# Patient Record
Sex: Female | Born: 1970 | Hispanic: Yes
Health system: Southern US, Community
[De-identification: ages and names within clinical notes are randomized; demographics above are authoritative.]

## PROBLEM LIST (undated history)

## (undated) DIAGNOSIS — G43909 Migraine, unspecified, not intractable, without status migrainosus: Secondary | ICD-10-CM

## (undated) DIAGNOSIS — I00 Rheumatic fever without heart involvement: Secondary | ICD-10-CM

## (undated) DIAGNOSIS — R519 Headache, unspecified: Secondary | ICD-10-CM

## (undated) DIAGNOSIS — E559 Vitamin D deficiency, unspecified: Secondary | ICD-10-CM

## (undated) DIAGNOSIS — R51 Headache: Secondary | ICD-10-CM

## (undated) DIAGNOSIS — IMO0002 Reserved for concepts with insufficient information to code with codable children: Secondary | ICD-10-CM

## (undated) DIAGNOSIS — Z8759 Personal history of other complications of pregnancy, childbirth and the puerperium: Secondary | ICD-10-CM

## (undated) HISTORY — DX: Personal history of other complications of pregnancy, childbirth and the puerperium: Z87.59

## (undated) HISTORY — DX: Vitamin D deficiency, unspecified: E55.9

## (undated) HISTORY — PX: CERVICAL BIOPSY  W/ LOOP ELECTRODE EXCISION: SUR135

## (undated) HISTORY — PX: DIAGNOSTIC LAPAROSCOPY: SUR761

## (undated) HISTORY — PX: COLPOSCOPY: SHX161

## (undated) HISTORY — DX: Migraine, unspecified, not intractable, without status migrainosus: G43.909

## (undated) HISTORY — DX: Reserved for concepts with insufficient information to code with codable children: IMO0002

## (undated) HISTORY — DX: Rheumatic fever without heart involvement: I00

---

## 2002-04-13 HISTORY — PX: ECTOPIC PREGNANCY SURGERY: SHX613

## 2013-08-11 ENCOUNTER — Other Ambulatory Visit: Payer: Self-pay | Admitting: Family Medicine

## 2013-08-11 DIAGNOSIS — R109 Unspecified abdominal pain: Secondary | ICD-10-CM

## 2013-08-18 ENCOUNTER — Other Ambulatory Visit: Payer: Self-pay

## 2013-08-21 ENCOUNTER — Ambulatory Visit
Admission: RE | Admit: 2013-08-21 | Discharge: 2013-08-21 | Disposition: A | Discharge status: Home / Self Care | Payer: BC Managed Care – PPO | Source: Ambulatory Visit | Attending: Family Medicine | Admitting: Family Medicine

## 2013-08-21 ENCOUNTER — Other Ambulatory Visit: Payer: Self-pay

## 2013-08-21 DIAGNOSIS — R109 Unspecified abdominal pain: Secondary | ICD-10-CM

## 2013-08-21 MED ORDER — IOHEXOL 300 MG/ML  SOLN
100.0000 mL | Freq: Once | INTRAMUSCULAR | Status: AC | PRN
  Administered 2013-08-21: 100 mL via INTRAVENOUS

## 2013-12-26 ENCOUNTER — Ambulatory Visit: Payer: Self-pay | Admitting: Gynecology

## 2014-01-16 ENCOUNTER — Other Ambulatory Visit (HOSPITAL_COMMUNITY)
Admission: RE | Admit: 2014-01-16 | Discharge: 2014-01-16 | Disposition: A | Discharge status: Home / Self Care | Payer: BC Managed Care – PPO | Source: Ambulatory Visit | Attending: Gynecology | Admitting: Gynecology

## 2014-01-16 ENCOUNTER — Ambulatory Visit (INDEPENDENT_AMBULATORY_CARE_PROVIDER_SITE_OTHER): Payer: BC Managed Care – PPO | Admitting: Gynecology

## 2014-01-16 ENCOUNTER — Encounter: Payer: Self-pay | Admitting: Gynecology

## 2014-01-16 VITALS — BP 102/70 | Ht 62.5 in | Wt 115.0 lb

## 2014-01-16 DIAGNOSIS — R87810 Cervical high risk human papillomavirus (HPV) DNA test positive: Secondary | ICD-10-CM | POA: Insufficient documentation

## 2014-01-16 DIAGNOSIS — Z1151 Encounter for screening for human papillomavirus (HPV): Secondary | ICD-10-CM | POA: Insufficient documentation

## 2014-01-16 DIAGNOSIS — Z01419 Encounter for gynecological examination (general) (routine) without abnormal findings: Secondary | ICD-10-CM

## 2014-01-16 DIAGNOSIS — Z01411 Encounter for gynecological examination (general) (routine) with abnormal findings: Secondary | ICD-10-CM | POA: Diagnosis present

## 2014-01-16 DIAGNOSIS — N946 Dysmenorrhea, unspecified: Secondary | ICD-10-CM

## 2014-01-16 DIAGNOSIS — N920 Excessive and frequent menstruation with regular cycle: Secondary | ICD-10-CM

## 2014-01-16 MED ORDER — TRANEXAMIC ACID 650 MG PO TABS: ORAL_TABLET | ORAL | Status: DC

## 2014-01-16 NOTE — Patient Instructions (Signed)
Levonorgestrel intrauterine device (IUD) Qu es este medicamento? El LEVONORGESTREL (DIU) es un dispositivo anticonceptivo (control de natalidad). El dispositivo se coloca dentro del tero por un profesional de la salud. Se utiliza para evitar el embarazo y tambin se puede utilizar para tratar el sangrado abundante que ocurre durante su perodo. Dependiendo del dispositivo, se puede utilizar por 3 a 5 aos. Este medicamento puede ser utilizado para otros usos; si tiene alguna pregunta consulte con su proveedor de atencin mdica o con su farmacutico. MARCAS COMERCIALES DISPONIBLES: LILETTA, Mirena, Skyla Qu le debo informar a mi profesional de la salud antes de tomar este medicamento? Necesita saber si usted presenta alguno de los siguientes problemas o situaciones: -exmen de Papanicolaou anormal -cncer de mama, cuello del tero o tero -diabetes -endometritis -si tiene una infeccin plvica o genital actual o en el pasado -tiene ms de una pareja sexual o si su pareja tiene ms de una pareja -enfermedad cardiaca -antecedente de embarazo tubrico o ectpico -problemas del sistema inmunolgico -DIU colocado -enfermedad heptica o tumor del hgado -problemas con la coagulacin o si toma diluyentes sanguneos -usa medicamentos intravenoso -forma inusual del tero -sangrado vaginal que no tiene explicacin -una reaccin alrgica o inusual al levonorgestrel, a otras hormonas, a la silicona o polietilenos, a otros medicamentos, alimentos, colorantes o conservantes -si est embarazada o buscando quedar embarazada -si est amamantando a un beb Cmo debo utilizar este medicamento? Un profesional de la salud coloca este dispositivo en el tero. Hable con su pediatra para informarse acerca del uso de este medicamento en nios. Puede requerir atencin especial. Sobredosis: Pngase en contacto inmediatamente con un centro toxicolgico o una sala de urgencia si usted cree que haya tomado  demasiado medicamento. ATENCIN: Este medicamento es solo para usted. No comparta este medicamento con nadie. Qu sucede si me olvido de una dosis? No se aplica en este caso. Qu puede interactuar con este medicamento? No tome esta medicina con ninguno de los siguientes medicamentos: -amprenavir -bosentano -fosamprenavir Esta medicina tambin puede interactuar con los siguientes medicamentos: -aprepitant -barbitricos para producir el sueo o para el tratamiento de convulsiones -bexaroteno -griseofulvina -medicamentos para tratar los convulsiones, tales como carbamazepina, etotona, felbamato, oxcarbazepina, fenitona, topiramato -modafinilo -pioglitazona -rifabutina -rifampicina -rifapentina -algunos medicamentos para tratar el virus VIH, tales como atazanavir, indinavir, lopinavir, nelfinavir, tipranavir, ritonavir -hierba de [place] -warfarina Puede ser que esta lista no menciona todas las posibles interacciones. Informe a su profesional de la salud de todos los productos a base de hierbas, medicamentos de venta libre o suplementos nutritivos que est tomando. Si usted fuma, consume bebidas alcohlicas o si utiliza drogas ilegales, indqueselo tambin a su profesional de la salud. Algunas sustancias pueden interactuar con su medicamento. A qu debo estar atento al usar este medicamento? Visite a su mdico o a su profesional de la salud para chequear su evolucin peridicamente. Visite a su mdico si usted o su pareja tiene relaciones sexuales con otras personas, se vuelve VIH positivo o contrae una enfermedad de transmisin sexual. Este medicamento no la protege de la infeccin por VIH (SIDA) ni de ninguna otra enfermedad de transmisin sexual. Puede controlar la ubicacin del DIU usted misma palpando con sus dedos limpios los hilos en la parte anterior de la vagina. No tire de los hilos. Es un buen hbito controlar la ubicacin del dispositivo despus de cada perodo menstrual. Si  no slo siente los hilos sino que adems siente otra parte ms del DIU o si no puede sentir los hilos, consulte a su   mdico inmediatamente. El DIU puede salirse por s solo. Puede quedar embarazada si el dispositivo se sale de su lugar. Utilice un mtodo anticonceptivo adicional, como preservativos, y consulte a su proveedor de atencin mdica s observa que el DIU se sali de su lugar. La utilizacin de tampones no cambia la posicin del DIU y no hay inconvenientes en usarlos durante su perodo. Qu efectos secundarios puedo tener al utilizar este medicamento? Efectos secundarios que debe informar a su mdico o a su profesional de la salud tan pronto como sea posible: -reacciones alrgicas como erupcin cutnea, picazn o urticarias, hinchazn de la cara, labios o lengua -fiebre, sntomas gripales -llagas genitales -alta presin sangunea -ausencia de un perodo menstrual durante 6 semanas mientras lo utiliza -dolor, hinchazn o calor en las piernas -dolor o sensibilidad del plvico -dolor de cabeza repentino o severo -signos de embarazo -calambres estomacales -falta de aliento repentina -problemas de coordinacin, del habla, al caminar -sangrado, flujo vaginal inusual -color amarillento de los ojos o la piel Efectos secundarios que, por lo general, no requieren atencin mdica (debe informarlos a su mdico o a su profesional de la salud si persisten o si son molestos): -acn -dolor de pecho -cambios en el deseo sexual o capacidad -cambios de peso -calambres, mareos o sensacin de desmayo mientras se introduce el dispositivo -dolor de cabeza -sangrado menstruales irregulares en los primeros 3 a 6 meses de usar -nuseas Puede ser que esta lista no menciona todos los posibles efectos secundarios. Comunquese a su mdico por asesoramiento mdico sobre los efectos secundarios. Usted puede informar los efectos secundarios a la FDA por telfono al 1-800-FDA-1088. Dnde debo guardar mi  medicina? No se aplica en este caso. ATENCIN: Este folleto es un resumen. Puede ser que no cubra toda la posible informacin. Si usted tiene preguntas acerca de esta medicina, consulte con su mdico, su farmacutico o su profesional de la salud.  2015, Elsevier/Gold Standard. (2011-05-19 16:57:41)  

## 2014-01-16 NOTE — Progress Notes (Signed)
Carolyn Fletcher 03/18/1971 [id number]   History:    43 y.o.  for annual gyn exam as a new patient to the practice. Patient's been complaining of dysmenorrhea and menorrhagia that has been worse for the past 5 months with passage of large clots. She denies any intermenstrual bleeding. Patient separated from her spouse several months ago. She had been using condoms for contraception. 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 Her last Pap smear was in [place] in 2014 we do not have any records. Her mammogram was also 3 years ago. Patient had a colonoscopy in 2013 because of rectal bleeding which turned out to be hemorrhoids. She does have a cousin who had anal cancer and both grandfather and grandmother on father's side had colorectal cancer. Patient received the flu vaccine 3 weeks ago.  Past medical history,surgical history, family history and social history were all reviewed and documented in the EPIC chart.  Gynecologic History Patient's last menstrual period was 12/25/2013. Contraception: none Last Pap: 2014. Results were: Patient states it was normal done in [place] we do not have the report Last mammogram: 2012. Results were: Done in [place] patient's stay was normal  Obstetric History OB History  Gravida Para Term Preterm AB SAB TAB Ectopic Multiple Living  4 3   1   1  3     # Outcome Date GA Lbr Len/2nd Weight Sex Delivery Anes PTL Lv  4 ECT           3 PAR           2 PAR           1 PAR                ROS: A ROS was performed and pertinent positives and negatives are included in the history.  GENERAL: No fevers or chills. HEENT: No change in vision, no earache, sore throat or sinus congestion. NECK: No pain or stiffness. CARDIOVASCULAR: No chest pain or pressure. No palpitations. PULMONARY: No  shortness of breath, cough or wheeze. GASTROINTESTINAL: No abdominal pain, nausea, vomiting or diarrhea, melena or bright red blood per rectum. GENITOURINARY: No urinary frequency, urgency, hesitancy or dysuria. MUSCULOSKELETAL: No joint or muscle pain, no back pain, no recent trauma. DERMATOLOGIC: No rash, no itching, no lesions. ENDOCRINE: No polyuria, polydipsia, no heat or cold intolerance. No recent change in weight. HEMATOLOGICAL: No anemia or easy bruising or bleeding. NEUROLOGIC: No headache, seizures, numbness, tingling or weakness. PSYCHIATRIC: No depression, no loss of interest in normal activity or change in sleep pattern.     Exam: chaperone present  BP 102/70  Ht 5' 2.5" (1.588 m)  Wt 115 lb (52.164 kg)  BMI 20.69 kg/m2  LMP 12/25/2013  Body mass index is 20.69 kg/(m^2).  General appearance : Well developed well nourished female. No acute distress HEENT: Neck supple, trachea midline, no carotid bruits, no thyroidmegaly Lungs: Clear to auscultation, no rhonchi or wheezes, or rib retractions  Heart: Regular rate and rhythm, no murmurs or gallops Breast:Examined in sitting and supine position were symmetrical in appearance, no palpable masses or tenderness,  no skin retraction, no nipple inversion, no nipple discharge, no skin discoloration, no axillary or supraclavicular lymphadenopathy Abdomen: no palpable masses or tenderness, no rebound or guarding Extremities: no  edema or skin discoloration or tenderness  Pelvic:  Bartholin, Urethra, Skene Glands: Within normal limits             Vagina: No gross lesions or discharge  Cervix: No gross lesions or discharge  Uterus  anteverted, normal size, shape and consistency, non-tender and mobile  Adnexa  Without masses or tenderness  Anus and perineum  normal   Rectovaginal  normal sphincter tone without palpated masses or tenderness             Hemoccult not provided     Assessment/Plan:  43 y.o. female for annual exam was  counseled and given information on the Mirena IUD to help with her menorrhagia as well as for contraception. I did prescribe her Lysteda  650 mg to take 2 tablets 3 times a day for 5 days during her cycle to help with her menorrhagia and dysmenorrhea until she has the Mirena IUD placed. The literature information was provided. Pap smear was done today. [facility] Ashby DawesGrube her blood work recently. She's currently being treated for vitamin D deficiency. She was given a requisition to schedule her mammogram. We discussed importance of monthly breast exam as was importance of calcium vitamin D and regular exercise for osteoporosis prevention.  Ok EdwardsFERNANDEZ,Carolyn Hoggard H MD, 2:55 PM 01/16/2014

## 2014-01-19 LAB — CYTOLOGY - PAP

## 2014-01-22 ENCOUNTER — Other Ambulatory Visit: Payer: Self-pay | Admitting: Gynecology

## 2014-01-22 ENCOUNTER — Telehealth: Payer: Self-pay | Admitting: Gynecology

## 2014-01-22 DIAGNOSIS — Z30431 Encounter for routine checking of intrauterine contraceptive device: Secondary | ICD-10-CM

## 2014-01-22 MED ORDER — LEVONORGESTREL 20 MCG/24HR IU IUD: INTRAUTERINE_SYSTEM | Freq: Once | INTRAUTERINE | Status: AC

## 2014-01-22 NOTE — Telephone Encounter (Signed)
01/22/14-I spoke w/pt  Today and told her that [facility] for contraception at 100%(per JF that is dx,not menorrhagia). She will call first day of next cycle to schedule.wl

## 2014-01-23 ENCOUNTER — Encounter: Payer: Self-pay | Admitting: Gynecology

## 2014-01-23 ENCOUNTER — Ambulatory Visit (INDEPENDENT_AMBULATORY_CARE_PROVIDER_SITE_OTHER): Payer: BC Managed Care – PPO | Admitting: Gynecology

## 2014-01-23 VITALS — BP 110/70 | Ht 64.0 in | Wt 116.0 lb

## 2014-01-23 DIAGNOSIS — R896 Abnormal cytological findings in specimens from other organs, systems and tissues: Secondary | ICD-10-CM

## 2014-01-23 DIAGNOSIS — Z30430 Encounter for insertion of intrauterine contraceptive device: Secondary | ICD-10-CM

## 2014-01-23 DIAGNOSIS — IMO0002 Reserved for concepts with insufficient information to code with codable children: Secondary | ICD-10-CM

## 2014-01-23 DIAGNOSIS — Z975 Presence of (intrauterine) contraceptive device: Secondary | ICD-10-CM

## 2014-01-23 NOTE — Progress Notes (Signed)
   Patient presented to the office today for placement of Mirena IUD for contraception and also for her dysmenorrhea and menorrhagia. She was seen in the office as a new patient on October 26 for her annual exam. Her Pap smear had demonstrated atypical squamous cells of undetermined significance with negative HPV. 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 Her last Pap smear prior to this one [place] in [place].                                                                    IUD procedure note       Patient presented to the office today for placement of Mirena IUD. The patient had previously been provided with literature information on this method of contraception. The risks benefits and pros and cons were discussed and all her questions were answered. She is fully aware that this form of contraception is 99% effective and is good for 5 years.  Pelvic exam: Bartholin urethra Skene glands: Within normal limits Vagina: No lesions or discharge, menstrual blood Cervix: No lesions or discharge Uterus: Anteverted position Adnexa: No masses or tenderness Rectal exam: Not done  The cervix was cleansed with Betadine solution. A single-tooth tenaculum was placed on the anterior cervical lip. The uterus sounded to 7 centimeter. The IUD was shown to the patient and inserted in a sterile fashion. The IUD string was trimmed. The single-tooth tenaculum was removed. Patient was instructed to return back to the office in one month for follow up.  Patient was informed of the atypical squamous cells of undetermined significance Pap smear result with negative HPV and the new guidelines in which the recommendation is to followup with a Pap smear in one year.      Lot. No.[id number]

## 2014-01-23 NOTE — Patient Instructions (Signed)
Colocacin de un dispositivo intrauterino (Intrauterine Device Insertion) El dispositivo intrauterino (DIU) se inserta en el tero para Therapist, occupational. [employer] tipos de DIU:  DIU de cobre: este tipo de DIU crea un ambiente desfavorable para la supervivencia de los espermatozoides. El mecanismo de accin no se conoce con Media planner. Puede permanecer colocado durante 10 aos.  DIU hormonal: este tipo de DIU contiene la hormona progestina (progesterona sinttica). La progestina espesa el moco cervical y evita que los espermatozoides ingresen al tero y tambin afina la membrana que tapiza interiormente al tero. No hay evidencias de que el DIU hormonal evite la implantacin. Un DIU hormonal puede dejarse colocado durante un mximo de 66aos, y un DIU hormonal diferente, durante un mximo de 3aos. Es el mtodo anticonceptivo de mejor relacin entre el costo y la efectividad si se deja colocado todo el tiempo de su duracin. Se puede retirar en cualquier momento. INFORME A SU MDICO:  Cualquier alergia que tenga.  Todos los [employer] [place], incluyendo vitaminas, hierbas, gotas oftlmicas, cremas y medicamentos de venta libre.  Problemas previos que usted o los [employer] de su familia hayan tenido con el uso de anestsicos.  Enfermedades de [employer].  Cirugas previas.  Posible embarazo.  Enfermedades patolgicas. RIESGOS Y COMPLICACIONES  Generalmente, la colocacin de un dispositivo intrauterino es un procedimiento seguro. Sin embargo, Games developer procedimiento, pueden surgir complicaciones. Las complicaciones posibles son:  Pinchazo accidental (perforacin) del tero.  La colocacin accidental del DIU en la capa muscular del tero (miometrio) o fuera del tero. Si esto sucede, el DIU puede quedar flotando alrededor [employer] intestinos y debe extraerse quirrgicamente.  El DIU puede salirse del tero (expulsin). Esto es ms frecuente en las mujeres que han dado a luz  recientemente.  Embarazo en la trompa de [place] (ectpico).  Enfermedad plvica inflamatoria (EPI), una infeccin del tero y las trompas de [place]. Mayor riesgo de tener EPI durante los primeros 20das despus de la colocacin del DIU; sin embargo, el riesgo general sigue siendo muy bajo. ANTES DEL PROCEDIMIENTO  Programe la insercin del DIU para cuando tenga el perodo menstrual o inmediatamente despus, para asegurarse de que no est embarazada. La colocacin del DIU se tolera mejor poco despus del ciclo menstrual.  Podra necesitar anlisis o ser examinada para asegurarse de que no est embarazada.  Es posible que tenga que hacerse un test de [place].  Podrn indicarle anlisis para descartar enfermedades de transmisin sexual (ETS) antes de Programmer, systems. Colocar un DIU a una mujer que tiene una infeccin puede hacer que esta empeore.  Podrn indicarle que tome un analgsico 1 o 2 horas antes del procedimiento.  Se realiza un examen para determinar el tamao y la posicin del tero.  Consulte a su mdico si debe cambiar o suspender los medicamentos que toma habitualmente. PROCEDIMIENTO   Se coloca un instrumento (espculo) en la vagina que le permite a su mdico observar la parte inferior del tero (cuello del tero).  El cuello del tero se prepara con un medicamento que disminuye el riesgo de infeccin.  Tal vez le apliquen un medicamento para adormecer cada lado del cuello del tero (bloqueo intracervical o paracervical). Esto se realiza para evitar y Engineer, maintenance con la insercin.  Se insertar un instrumento (sonda uterina) en el tero para determinar la longitud de la cavidad uterina y la direccin hacia la cual el tero puede inclinarse.  Se coloca un dispositivo delgado (insertador del DIU) a travs del canal del  cuello del tero Newell Rubbermaidhasta el tero.  El DIU se coloca en la cavidad uterina y el dispositivo de insercin se retira.  Se recorta el hilo de  nylon que est unido al DIU y que se [place]utiliza para su extraccin final. Se recorta de forma que quede en la vagina, apenas afuera del cuello uterino. DESPUS DEL PROCEDIMIENTO  Podr tener sangrado despus del procedimiento. Esto es normal. Vara desde un sangrado ligero durante un par de Countrywide Financialdas hasta un sangrado similar al menstrual.  Podr sentir clicos leves. Document Released: 12/23/2011 Document Revised: 01/18/2013 [facility] Patient Information 2015 [place], [place]. This information is not intended to replace advice given to you by your health care provider. Make sure you discuss any questions you have with your health care provider.

## 2014-02-12 ENCOUNTER — Encounter: Payer: Self-pay | Admitting: Gynecology

## 2014-02-20 ENCOUNTER — Ambulatory Visit: Payer: BC Managed Care – PPO | Admitting: Gynecology

## 2014-03-07 ENCOUNTER — Encounter: Payer: Self-pay | Admitting: Gynecology

## 2014-03-07 ENCOUNTER — Ambulatory Visit (INDEPENDENT_AMBULATORY_CARE_PROVIDER_SITE_OTHER): Payer: BC Managed Care – PPO | Admitting: Gynecology

## 2014-03-07 VITALS — BP 110/76

## 2014-03-07 DIAGNOSIS — Z30431 Encounter for routine checking of intrauterine contraceptive device: Secondary | ICD-10-CM

## 2014-03-07 DIAGNOSIS — IMO0002 Reserved for concepts with insufficient information to code with codable children: Secondary | ICD-10-CM | POA: Insufficient documentation

## 2014-03-07 DIAGNOSIS — R896 Abnormal cytological findings in specimens from other organs, systems and tissues: Secondary | ICD-10-CM

## 2014-03-07 MED ORDER — ESTRADIOL 0.5 MG PO TABS: ORAL_TABLET | ORAL | Status: DC

## 2014-03-07 NOTE — Progress Notes (Signed)
   Patient presented to the office today for her 1 month follow-up after having placed the Mirena IUD on 01/23/2014. Patient had heavy bleeding the first few weeks and has been spotting on and off. 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 Her last Pap smear prior to this one [place].  Exam: Bartholin urethra Skene was within normal limits Vagina: Menstrual blood was present Cervix: Distal blood was present IUD string was seen Bimanual exam uterus normal size shape and consistency anteverted nontender Adnexa: No palpable masses or tenderness Rectal exam not done  Explained to the patient that according to the new ASCCP guidelines of the recommendation with a negative HPV would be to follow-up with a Pap smear in 3 years. Since we do not have records from [place] a would like to repeat her Pap smear 1 year and if still negative then we can follow with the new guidelines every 3 years. She's also been placed on Estrace 0.5 mg 1 by mouth daily for 25 days to stabilize her endometrium down her bleeding. Risks benefits and pros and cons were discussed. Patient several years ago had been on the oral contraceptive pills without any complications.

## 2015-03-13 ENCOUNTER — Encounter: Payer: Self-pay | Admitting: Gynecology

## 2015-03-15 ENCOUNTER — Encounter: Payer: Self-pay | Admitting: Gynecology

## 2015-05-16 ENCOUNTER — Ambulatory Visit (INDEPENDENT_AMBULATORY_CARE_PROVIDER_SITE_OTHER): Payer: 59 | Admitting: Gynecology

## 2015-05-16 ENCOUNTER — Encounter: Payer: Self-pay | Admitting: Gynecology

## 2015-05-16 ENCOUNTER — Other Ambulatory Visit (HOSPITAL_COMMUNITY)
Admission: RE | Admit: 2015-05-16 | Discharge: 2015-05-16 | Disposition: A | Discharge status: Home / Self Care | Payer: 59 | Source: Ambulatory Visit | Attending: Gynecology | Admitting: Gynecology

## 2015-05-16 VITALS — BP 118/80 | Ht 64.0 in | Wt 123.0 lb

## 2015-05-16 DIAGNOSIS — N9489 Other specified conditions associated with female genital organs and menstrual cycle: Secondary | ICD-10-CM | POA: Diagnosis not present

## 2015-05-16 DIAGNOSIS — Z01411 Encounter for gynecological examination (general) (routine) with abnormal findings: Secondary | ICD-10-CM | POA: Insufficient documentation

## 2015-05-16 DIAGNOSIS — B9689 Other specified bacterial agents as the cause of diseases classified elsewhere: Secondary | ICD-10-CM

## 2015-05-16 DIAGNOSIS — IMO0002 Reserved for concepts with insufficient information to code with codable children: Secondary | ICD-10-CM

## 2015-05-16 DIAGNOSIS — Z1151 Encounter for screening for human papillomavirus (HPV): Secondary | ICD-10-CM | POA: Diagnosis present

## 2015-05-16 DIAGNOSIS — Z01419 Encounter for gynecological examination (general) (routine) without abnormal findings: Secondary | ICD-10-CM

## 2015-05-16 DIAGNOSIS — A499 Bacterial infection, unspecified: Secondary | ICD-10-CM | POA: Diagnosis not present

## 2015-05-16 DIAGNOSIS — N760 Acute vaginitis: Secondary | ICD-10-CM | POA: Diagnosis not present

## 2015-05-16 DIAGNOSIS — R102 Pelvic and perineal pain unspecified side: Secondary | ICD-10-CM

## 2015-05-16 DIAGNOSIS — N898 Other specified noninflammatory disorders of vagina: Secondary | ICD-10-CM

## 2015-05-16 DIAGNOSIS — R896 Abnormal cytological findings in specimens from other organs, systems and tissues: Secondary | ICD-10-CM | POA: Diagnosis not present

## 2015-05-16 LAB — WET PREP FOR TRICH, YEAST, CLUE
CLUE CELLS WET PREP: NONE SEEN
Trich, Wet Prep: NONE SEEN
Yeast Wet Prep HPF POC: NONE SEEN

## 2015-05-16 MED ORDER — TINIDAZOLE 500 MG PO TABS
ORAL_TABLET | ORAL | Status: DC
Start: 2015-05-16 — End: 2015-09-12

## 2015-05-16 NOTE — Addendum Note (Signed)
Addended by: Rushie Goltz on: 05/16/2015 12:45 PM   Modules accepted: Orders

## 2015-05-16 NOTE — Patient Instructions (Signed)
Tinidazole tablets Qu es este medicamento? El TINIDAZOL es un medicamento antiinfeccioso. Se utiliza para tratar la amebiasis, giardiasis, tricomonosis y vaginosis. No es efectivo para resfros, gripe u otras infecciones de origen viral. Este medicamento puede ser utilizado para otros usos; si tiene alguna pregunta consulte con su proveedor de atencin mdica o con su farmacutico. Qu le debo informar a mi profesional de la salud antes de tomar este medicamento? Necesita saber si usted presenta alguno de los siguientes problemas o situaciones: -anemia u otros trastornos sanguneos -si consume bebidas alcohlicas con frecuencia -recibe hemodilisis -trastorno de convulsiones -una reaccin alrgica o inusual al tinidazol, a otros medicamentos, alimentos, colorantes o conservantes -si est embarazada o buscando quedar embarazada -si est amamantando a un beb Cmo debo utilizar este medicamento? Tome este medicamento por va oral con un vaso lleno de agua. Siga las instrucciones de la etiqueta del medicamento. Tomar con alimentos. Tome sus dosis a intervalos regulares. No tome su medicamento con una frecuencia mayor a la indicada. Complete todas las dosis de su medicamento como se le haya indicado aun si se siente mejor. No omita ninguna dosis o suspenda el uso de su medicamento antes de lo indicado. Hable con su pediatra para informarse acerca del uso de este medicamento en nios. Aunque este medicamento ha sido recetado a nios tan menores como de 3 aos de edad para condiciones selectivas, las precauciones se aplican. Sobredosis: Pngase en contacto inmediatamente con un centro toxicolgico o una sala de urgencia si usted cree que haya tomado demasiado medicamento. ATENCIN: Este medicamento es solo para usted. No comparta este medicamento con nadie. Qu sucede si me olvido de una dosis? Si olvida una dosis, tmela lo antes posible. Si es casi la hora de la prxima dosis, tome slo esa  dosis. No tome dosis adicionales o dobles. Qu puede interactuar con este medicamento? No tome esta medicina con ninguno de los siguientes medicamentos: -alcohol o cualquier producto que contenga alcohol -solucin oral de amprenavir -disulfiram -inyeccin de paclitaxel -solucin oral de ritonavir -solucin oral de sertralina -inyeccin de sulfametoxasol-trimetoprima Esta medicina tambin puede interactuar con los siguientes medicamentos: -colestiramina -cimetidina -conivaptn -ciclosporina -fluorouracilo -fosfenitona, fenitona -quetoconazol -litio -fenobarbital -tacrolimo -warfarina Puede ser que esta lista no menciona todas las posibles interacciones. Informe a su profesional de la salud de todos los productos a base de hierbas, medicamentos de venta libre o suplementos nutritivos que est tomando. Si usted fuma, consume bebidas alcohlicas o si utiliza drogas ilegales, indqueselo tambin a su profesional de la salud. Algunas sustancias pueden interactuar con su medicamento. A qu debo estar atento al usar este medicamento? Si los sntomas no mejoran o si empeoran, consulte con su mdico o con su profesional de la salud. Evite consumir las bebidas alcohlicas mientras toma este medicamento y durante tres das despus. El alcohol puede hacerle sentir mareado, enfermo o enrojecimiento. Si est recibiendo tratamiento para alguna enfermedad de transmisin sexual, evite todo contacto sexual hasta que haya terminado el tratamiento. Es posible que su pareja tambin necesite este tratamiento. Qu efectos secundarios puedo tener al utilizar este medicamento? Efectos secundarios que debe informar a su mdico o a su profesional de la salud tan pronto como sea posible: -reacciones alrgicas como erupcin cutnea, picazn o urticarias, hinchazn de la cara, labios o lengua -problemas respiratorios -confusin, depresin -manchas oscuras o blancas en la boca -sensacin de desmayos o mareos,  cadas -fiebre, infeccin -entumecimiento, hormigueo, dolor o debilidad en las manos o pies -dolor al orinar -convulsiones -cansancio o debilidad inusual -irritacin   o flujo vaginal -vmito Efectos secundarios que, por lo general, no requieren atencin mdica (debe informarlos a su mdico o a su profesional de la salud si persisten o si son molestos): -orina de color marrn oscuro o rojo -diarrea -dolor de cabeza -prdida del apetito -sabor metlico -nuseas -Higher education careers adviser Puede ser que esta lista no menciona todos los posibles efectos secundarios. Comunquese a su mdico por asesoramiento mdico [employer] Usted puede informar los efectos secundarios a la FDA por telfono al 1-800-FDA-1088. Dnde debo guardar mi medicina? Mantngala fuera del alcance de los nios. Gurdela a [employer], entre 15 y 55 grados C (51 y 89 grados F). Protjala de la luz y de la humedad. Mantenga el envase bien cerrado. Deseche todo el medicamento que no haya utilizado, despus de la fecha de vencimiento. ATENCIN: Este folleto es un resumen. Puede ser que no cubra toda la posible informacin. Si usted tiene preguntas acerca de esta medicina, consulte con su mdico, su farmacutico o su profesional de Technical sales engineer.    2016, Elsevier/Gold Standard. (2014-05-22 00:00:00) Vaginosis bacteriana (Bacterial Vaginosis) La vaginosis bacteriana es una infeccin vaginal que perturba el equilibrio normal de las bacterias que se encuentran en la vagina. Es el resultado de un crecimiento excesivo de ciertas bacterias. Esta es la infeccin vaginal ms frecuente en mujeres en edad reproductiva. El tratamiento es importante para prevenir complicaciones, especialmente en mujeres embarazadas, dado que puede causar un parto prematuro. CAUSAS  La vaginosis bacteriana se origina por un aumento de bacterias nocivas que, generalmente, estn presentes en cantidades ms pequeas en la vagina. Varios  tipos diferentes de bacterias pueden causar esta afeccin. Sin embargo, la causa de su desarrollo no se comprende totalmente. [place] o comportamientos pueden exponerlo a un mayor riesgo de desarrollar vaginosis bacteriana, entre los que se incluyen:  Tener una nueva pareja sexual o mltiples parejas sexuales.  Las duchas vaginales  El uso del DIU (dispositivo intrauterino) como mtodo anticonceptivo. El contagio no se produce en baos, por ropas de cama, en piscinas o por contacto con objetos. SIGNOS Y SNTOMAS  Algunas mujeres que padecen vaginosis bacteriana no presentan signos ni sntomas. Los sntomas ms comunes son:  Secrecin vaginal de color grisceo.  Secrecin vaginal con olor similar al [employer], especialmente despus de Retail banker.  Picazn o sensacin de ardor en la vagina o la vulva.  Ardor o dolor al [employer]. DIAGNSTICO  Su mdico analizar su historia clnica y le examinar la vagina para detectar signos de vaginosis bacteriana. Puede tomarle Truddie Coco de flujo vaginal. Su mdico examinar esta muestra con un microscopio para controlar las bacterias y clulas anormales. Tambin puede realizarse un anlisis del pH vaginal.  TRATAMIENTO  La vaginosis bacteriana puede tratarse con antibiticos, en forma de comprimidos o de crema vaginal. Puede indicarse una segunda tanda de antibiticos si la afeccin se repite despus del tratamiento. Debido a que la vaginosis bacteriana aumenta el riesgo de contraer enfermedades de transmisin sexual, el tratamiento puede ayudar a reducir el riesgo de clamidia, [place], VIH y herpes. [place] solo medicamentos de venta libre o recetados, segn las indicaciones del mdico.  Si le han recetado antibiticos, tmelos como se le indic. Asegrese de que finaliza la prescripcin completa aunque se sienta mejor.  Comunique a sus compaeros sexuales que  sufre una infeccin vaginal. Deben consultar a su mdico y recibir tratamiento si tienen problemas, como picazn o una erupcin cutnea  leve.  Durante el tratamiento, es importante que siga estas indicaciones:  Evite mantener relaciones sexuales o use preservativos de la forma correcta.  No se haga duchas vaginales.  Evite consumir alcohol como se lo haya indicado el mdico.  Community education officer se lo haya indicado el mdico. SOLICITE ATENCIN MDICA SI:   Sus sntomas no mejoran despus de 3 das de [place].  Aumenta la secrecin o Conservation officer, historic buildings.  Tiene fiebre. ASEGRESE DE QUE:   Comprende estas instrucciones.  Controlar su afeccin.  Recibir ayuda de inmediato si no mejora o si empeora. PARA OBTENER MS INFORMACIN  Centros para el control y la prevencin de Probation officer for Disease Control and Prevention, CDC): [employer] Asociacin Estadounidense de la Salud Sexual (American Sexual Health Association, SHA): www.ashastd.org    Esta informacin no tiene Marine scientist el consejo del mdico. Asegrese de hacerle al mdico cualquier pregunta que tenga.   Document Released: 07/07/2007 Document Revised: 04/20/2014 Elsevier Interactive Patient Education [employer].

## 2015-05-16 NOTE — Progress Notes (Signed)
Carolyn Fletcher [birth year] [id number]   History:    45 y.o.  for annual gyn exam who was complaining of vaginal odor. She has not been sexually active in over 2 years. She had a Mirena IUD placed in 2015 and isno longer suffering from menorrhagia passage of large blood clots and cramping. 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 Her last Pap smear was in [place] in 2014 we do not have any records. Her mammogram was also 3 years ago. Patient had a colonoscopy in 2013 because of rectal bleeding which turned out to be hemorrhoids. She does have a cousin who had anal cancer and both grandfather and grandmother on father's side had colorectal cancer.  Patient also was complaining of a tenderness no movement of her right shoulder.   Past medical history,surgical history, family history and social history were all reviewed and documented in the EPIC chart.  Gynecologic History Patient's last menstrual period was 01/13/2015 (lmp unknown). Contraception: IUD Last Pap: 2015. Results were: ASCUS negative HPV Last mammogram: See above. Results were: See above  Obstetric History OB History  Gravida Para Term Preterm AB SAB TAB Ectopic Multiple Living  # Outcome Date GA Lbr Len/2nd Weight Sex Delivery Anes PTL Lv  4 Ectopic           3 Para           2 Para           1 Para                ROS: A ROS was performed and pertinent positives and negatives are included in the history.  GENERAL: No fevers or chills. HEENT: No change in vision, no earache, sore throat or sinus congestion. NECK: No pain or stiffness. CARDIOVASCULAR: No chest pain or pressure. No palpitations. PULMONARY: No shortness of breath, cough or wheeze. GASTROINTESTINAL: No abdominal pain, nausea, vomiting or diarrhea, melena or bright red blood  per rectum. GENITOURINARY: No urinary frequency, urgency, hesitancy or dysuria. MUSCULOSKELETAL: No joint or muscle pain, no back pain, no recent trauma. DERMATOLOGIC: No rash, no itching, no lesions. ENDOCRINE: No polyuria, polydipsia, no heat or cold intolerance. No recent change in weight. HEMATOLOGICAL: No anemia or easy bruising or bleeding. NEUROLOGIC: No headache, seizures, numbness, tingling or weakness. PSYCHIATRIC: No depression, no loss of interest in normal activity or change in sleep pattern.     Exam: chaperone present  BP 118/80 mmHg  Ht  (1.626 m)  Wt 123 lb (55.792 kg)  BMI 21.10 kg/m2  LMP 01/13/2015 (LMP Unknown)  Body mass index is 21.1 kg/(m^2).  General appearance : Well developed well nourished female. No acute distress HEENT: Eyes: no retinal hemorrhage or exudates,  Neck supple, trachea midline, no carotid bruits, no thyroidmegaly Lungs: Clear to auscultation, no rhonchi or wheezes, or rib retractions  Heart: Regular rate and rhythm, no murmurs or gallops Breast:Examined in sitting and supine position were symmetrical in appearance, no palpable masses or tenderness,  no skin retraction, no nipple inversion, no nipple discharge, no skin discoloration, no axillary or supraclavicular lymphadenopathy Abdomen: no palpable masses or tenderness, no rebound or guarding Extremities: no edema or skin discoloration or tenderness, limited range of motion right  shoulder area tender 2 hyperpigmented skin lesion base of neck on right side Pelvic:  Bartholin, Urethra, Skene Glands: Within normal limits             Vagina: No gross lesions or discharge  Cervix: No gross lesions or discharge, IUD string seen  Uterus  anteverted, normal size, shape and consistency, non-tender and mobile  Adnexa tender right lower quadrant  Anus and perineum  normal   Rectovaginal  normal sphincter tone without palpated masses or tenderness             Hemoccult not indicated   Wet prep  moderate WBC, moderate bacteria  Assessment/Plan:  45 y.o. female for annual exam will be treated with Tindamax 500 mg tablet. 4 tablets today for talus tomorrow for bacterial vaginosis. Patient will be referred to the orthopedic surgeon for possible rotator cuff injury. She'll also be referred to the dermatologist for biopsy of the hyperpigmented lesion at the base of her neck. Pap smear with HPV screening done today since 12 months ago her Pap smear demonstrated ascus but negative HPV. An ultrasound will be ordered to assess her right lower quadrants and she was tender and I could not palpate her ovary because of some guarding. She will come at the same time for her fasting blood work to include the following: Comprehensive metabolic panel, TSH, CBC, fasting lipid profile, and urinalysis.   Ok Edwards MD, 12:13 PM 05/16/2015

## 2015-05-17 LAB — URINALYSIS W MICROSCOPIC + REFLEX CULTURE
BACTERIA UA: NONE SEEN [HPF]
BILIRUBIN URINE: NEGATIVE
CASTS: NONE SEEN [LPF]
CRYSTALS: NONE SEEN [HPF]
Glucose, UA: NEGATIVE
Hgb urine dipstick: NEGATIVE
KETONES UR: NEGATIVE
Leukocytes, UA: NEGATIVE
Nitrite: NEGATIVE
PROTEIN: NEGATIVE
Specific Gravity, Urine: 1.016 (ref 1.001–1.035)
Squamous Epithelial / LPF: NONE SEEN [HPF] (ref <=5)
WBC UA: NONE SEEN WBC/HPF (ref <=5)
Yeast: NONE SEEN [HPF]
pH: 6 (ref 5.0–8.0)

## 2015-05-18 LAB — URINE CULTURE
Colony Count: NO GROWTH
ORGANISM ID, BACTERIA: NO GROWTH

## 2015-05-20 ENCOUNTER — Other Ambulatory Visit: Payer: Self-pay

## 2015-05-20 DIAGNOSIS — Z1231 Encounter for screening mammogram for malignant neoplasm of breast: Secondary | ICD-10-CM

## 2015-05-21 LAB — CYTOLOGY - PAP

## 2015-05-22 ENCOUNTER — Other Ambulatory Visit: Payer: Self-pay | Admitting: Gynecology

## 2015-05-22 ENCOUNTER — Encounter: Payer: Self-pay | Admitting: Gynecology

## 2015-05-22 ENCOUNTER — Ambulatory Visit (INDEPENDENT_AMBULATORY_CARE_PROVIDER_SITE_OTHER): Payer: 59

## 2015-05-22 ENCOUNTER — Ambulatory Visit (INDEPENDENT_AMBULATORY_CARE_PROVIDER_SITE_OTHER): Payer: 59 | Admitting: Gynecology

## 2015-05-22 VITALS — BP 120/76

## 2015-05-22 DIAGNOSIS — R188 Other ascites: Secondary | ICD-10-CM

## 2015-05-22 DIAGNOSIS — R102 Pelvic and perineal pain: Secondary | ICD-10-CM

## 2015-05-22 DIAGNOSIS — N839 Noninflammatory disorder of ovary, fallopian tube and broad ligament, unspecified: Secondary | ICD-10-CM | POA: Diagnosis not present

## 2015-05-22 DIAGNOSIS — N7011 Chronic salpingitis: Secondary | ICD-10-CM

## 2015-05-22 DIAGNOSIS — N838 Other noninflammatory disorders of ovary, fallopian tube and broad ligament: Secondary | ICD-10-CM

## 2015-05-22 DIAGNOSIS — R896 Abnormal cytological findings in specimens from other organs, systems and tissues: Secondary | ICD-10-CM

## 2015-05-22 DIAGNOSIS — IMO0002 Reserved for concepts with insufficient information to code with codable children: Secondary | ICD-10-CM

## 2015-05-22 DIAGNOSIS — N83202 Unspecified ovarian cyst, left side: Secondary | ICD-10-CM | POA: Diagnosis not present

## 2015-05-22 MED ORDER — MEDROXYPROGESTERONE ACETATE 150 MG/ML IM SUSP
150.0000 mg | Freq: Once | INTRAMUSCULAR | Status: AC
  Administered 2015-05-22: 150 mg via INTRAMUSCULAR

## 2015-05-22 NOTE — Patient Instructions (Signed)
Marcador tumoral CA-125 (CA-125 Tumor Marker Test) POR QU ME DEBO REALIZAR ESTA PRUEBA? Esta prueba tiene como fin verificar el nivel del antgeno del cncer125 (CA-125) en la sangre. La prueba de marcador tumoral CA-125 puede ser til para detectar el cncer de ovario y se realiza solamente si se considera que la mujer corre un alto riesgo de presentar este tipo de cncer. El mdico puede recomendarle esta prueba si:  Usted tiene importantes antecedentes familiares de cncer de ovario.  Usted tiene un defecto gentico en el antgeno del cncer de mama (BRCA, por sus siglas en ingls). Si ya le han diagnosticado cncer de ovario, el mdico puede hacerle esta prueba como ayuda para identificar el grado de la enfermedad y controlar cmo responde al tratamiento. QU TIPO DE MUESTRA SE TOMA? Para esta prueba, se extrae una muestra de sangre. Por lo general, para extraerla, se introduce una aguja en una vena. CMO DEBO PREPARARME PARA ESTA PRUEBA? No se requiere una preparacin para esta prueba. QU SON LOS VALORES DE REFERENCIA? Los valores de referencia son los valores saludables establecidos despus de realizarle el anlisis a un grupo grande de personas sanas. Pueden variar entre diferentes personas, laboratorios y hospitales. Es su responsabilidad retirar el resultado del estudio. Consulte en el laboratorio o en el departamento en el que fue realizado el estudio cundo y cmo podr obtener los resultados. El valor de referencia para esta prueba es de 0a 35unidades/ml o menos de 35unidades/l (unidades SI). QU SIGNIFICAN LOS RESULTADOS? Los niveles ms altos de CA-125 pueden indicar lo siguiente:  Ciertos tipos de cncer, entre ellos:  Cncer de ovario.  Cncer de pncreas.  Cncer de colon.  Cncer de pulmn.  Cncer de mama.  Linfomas.  Trastornos no cancerosos (benignos), entre ellos:  Cirrosis.  Embarazo.  Endometriosis.  Pancreatitis.  Enfermedad plvica  inflamatoria (EPI). Hable con el mdico sobre los resultados, las opciones de tratamiento y, si es necesario, la necesidad de realizar ms estudios. Hable con el mdico si tiene alguna pregunta sobre los resultados.   Esta informacin no tiene como fin reemplazar el consejo del mdico. Asegrese de hacerle al mdico cualquier pregunta que tenga.   Document Released: 01/18/2013 Document Revised: 04/20/2014 Elsevier Interactive Patient Education 2016 Elsevier Inc. Quiste ovrico (Ovarian Cyst) Un quiste ovrico es una bolsa llena de lquido que se forma en el ovario. Los ovarios son los rganos pequeos que producen vulos en las mujeres. Se pueden formar varios tipos de quistes en los ovarios. La mayora no son cancerosos. Muchos de ellos no causan problemas y con frecuencia desaparecen solos. Algunos pueden provocar sntomas y requerir tratamiento. Los tipos ms comunes de quistes ovricos son los siguientes:  Quistes funcionales: estos quistes pueden aparecer todos los meses durante el ciclo menstrual. Esto es normal. Estos quistes suelen desaparecer con el prximo ciclo menstrual si la mujer no queda embarazada. En general, los quistes funcionales no tienen sntomas.  Endometriomas: estos quistes se forman a partir del tejido que recubre el tero. Tambin se denominan "quistes de chocolate" porque se llenan de sangre que se vuelve marrn. Este tipo de quiste puede provocar dolor en la zona inferior del abdomen durante la relacin sexual y con el perodo menstrual.  Cistoadenomas: este tipo se desarrolla a partir de las clulas que se ubican en el exterior del ovario. Estos quistes pueden ser muy grandes y causar dolor en la zona inferior del abdomen y durante la relacin sexual. Este tipo de quiste puede girar sobre s mismo, cortar   el suministro de sangre y causar un dolor intenso. Tambin se puede romper con facilidad y provocar mucho dolor.  Quistes dermoides: este tipo de quiste a veces se  encuentra en ambos ovarios. Estos quistes pueden contener diferentes tipos de tejidos del organismo, como piel, dientes, pelo o cartlago. Generalmente no tienen sntomas, a menos que sean muy grandes.  Quistes tecalutenicos: aparecen cuando se produce demasiada cantidad de cierta hormona (gonadotropina corinica humana) que estimula en exceso al ovario para que produzca vulos. Esto es ms frecuente despus de procedimientos que ayudan a la concepcin de un beb (fertilizacin in vitro). CAUSAS   Los medicamentos para la fertilidad pueden provocar una afeccin mediante la cual se forman mltiples quistes de gran tamao en los ovarios. Esta se denomina sndrome de hiperestimulacin ovrica.  El sndrome del ovario poliqustico es una afeccin que puede causar desequilibrios hormonales, los cuales pueden dar como resultado quistes ovricos no funcionales. SIGNOS Y SNTOMAS  Muchos quistes ovricos no causan sntomas. Si se presentan sntomas, stos pueden ser:  Dolor o molestias en la pelvis.  Dolor en la parte baja del abdomen.  Dolor durante las relaciones sexuales.  Aumento del permetro abdominal (hinchazn).  Perodos menstruales anormales.  Aumento del dolor en los perodos menstruales.  Cese de los perodos menstruales sin estar embarazada. DIAGNSTICO  Estos quistes se descubren comnmente durante un examen de rutina o una exploracin ginecolgica anual. Es posible que se ordenen otros estudios para obtener ms informacin sobre el quiste. Estos estudios pueden ser:  Ecografas.  Radiografas de la pelvis.  Tomografa computada.  Resonancia magntica.  Anlisis de sangre. TRATAMIENTO  Muchos de los quistes ovricos desaparecen por s solos, sin tratamiento. Es probable que el mdico quiera controlar el quiste regularmente durante 2 o 3meses para ver si se produce algn cambio. En el caso de las mujeres en la menopausia, es particularmente importante controlar de cerca al  quiste ya que el ndice de cncer de ovario en las mujeres menopusicas es ms alto. Cuando se requiere tratamiento, este puede incluir cualquiera de los siguientes:  Un procedimiento para drenar el quiste (aspiracin). Esto se puede realizar mediante el uso de una aguja grande y una ecografa. Tambin se puede hacer a travs de un procedimiento laparoscpico, En este procedimiento, se inserta un tubo delgado que emite luz y que tiene una pequea cmara en un extremo (laparoscopio) a travs de una pequea incisin.  Ciruga para extirpar el quiste completo. Esto se puede realizar mediante una ciruga laparoscpica o una ciruga abierta, la cual implica realizar una incisin ms grande en la parte inferior del abdomen.  Tratamiento hormonal o pldoras anticonceptivas. Estos mtodos a veces se usan para ayudar a disolver un quiste. INSTRUCCIONES PARA EL CUIDADO EN EL HOGAR   [place] solo medicamentos de venta libre o recetados, segn las indicaciones del mdico.  Concurra a las consultas de control con su mdico segn las indicaciones.  Hgase exmenes plvicos regulares y pruebas de Papanicolaou. SOLICITE ATENCIN MDICA SI:   Los perodos se atrasan, son irregulares, dolorosos o cesan.  El dolor plvico o abdominal no desaparece.  El abdomen se agranda o se hincha.  Siente presin en la vejiga o no puede vaciarla completamente.  Siente dolor durante las relaciones sexuales.  Tiene una sensacin de hinchazn, presin o molestias en el estmago.  Pierde peso sin razn aparente.  Siente un malestar generalizado.  Est estreida.  Pierde el apetito.  Le aparece acn.  Nota un aumento del vello corporal y facial.    Aumenta de peso sin hacer modificaciones en su actividad fsica y en su dieta habitual.  Sospecha que est embarazada. SOLICITE ATENCIN MDICA DE INMEDIATO SI:   Siente cada vez ms dolor abdominal.  Tiene malestar estomacal (nuseas) y vomita.  Tiene fiebre que se  presenta de manera repentina.  Siente dolor abdominal al defecar.  Sus perodos menstruales son ms abundantes que lo habitual. ASEGRESE DE QUE:   Comprende estas instrucciones.  Controlar su afeccin.  Recibir ayuda de inmediato si no mejora o si empeora.   Esta informacin no tiene como fin reemplazar el consejo del mdico. Asegrese de hacerle al mdico cualquier pregunta que tenga.   Document Released: 01/07/2005 Document Revised: 04/04/2013 Elsevier Interactive Patient Education 2016 Elsevier Inc.  

## 2015-05-22 NOTE — Progress Notes (Signed)
  Patient  Is a 45 year old  Who was seen in the office fibrinous second for her annual gynecological examination anus here to discuss her ultrasound due to the fact that the Mirena IUD that was placed in 2015 the string was not visualized. 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 Her last Pap smear was in [place] in 2014 we do not have any records. Her Pap smear here in 2016 demonstrated ASCUS negative HPV an on repeat on February 2017 Pap smear demonstrated the same ASCUS with negative HPV for which she will schedule a colposcopy in the next couple weeks.   Patient has informing that many years ago she had an ectopic pregnancy [place] but does not remember which side it was and which fallopian tube  Was removed or not.   Ultrasound today:  IUD string was visualized. Right ovarian follicle measured 14 x 10 mm was noted free fluid around the right adnexa measuring 30 x 34 x 11 mm was reported. Floating tubular structures noted. Left ovary thinwall cyst measuring 27 x 16 x 19 mm with solid component measuring 16 x 9 x 12 mm.  With arch her blood flow to the mass. Left ovarian her blood flow was seen   Assessment/plan: 45 year old patient with Pap smear ASCUS negative HPV 2 will return back to the office in 1-2 weeks for detail colposcopic evaluation. Also past history of ectopic pregnancy not sure which side was effected and if the fallopian tube was removed. 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 She will receive a shot of Depo-Provera 150 mg IM and returned to the office in 3 months for follow-up  ultrasound on this ovarian cyst as well. All the above information was provided to the patient Spanish.

## 2015-05-23 ENCOUNTER — Other Ambulatory Visit: Payer: 59

## 2015-05-23 DIAGNOSIS — Z01419 Encounter for gynecological examination (general) (routine) without abnormal findings: Secondary | ICD-10-CM

## 2015-05-23 LAB — COMPREHENSIVE METABOLIC PANEL
ALBUMIN: 4.2 g/dL (ref 3.6–5.1)
ALT: 35 U/L — ABNORMAL HIGH (ref 6–29)
AST: 24 U/L (ref 10–30)
Alkaline Phosphatase: 60 U/L (ref 33–115)
BILIRUBIN TOTAL: 0.5 mg/dL (ref 0.2–1.2)
BUN: 12 mg/dL (ref 7–25)
CHLORIDE: 104 mmol/L (ref 98–110)
CO2: 22 mmol/L (ref 20–31)
CREATININE: 0.7 mg/dL (ref 0.50–1.10)
Calcium: 9.1 mg/dL (ref 8.6–10.2)
Glucose, Bld: 80 mg/dL (ref 65–99)
Potassium: 4.2 mmol/L (ref 3.5–5.3)
SODIUM: 135 mmol/L (ref 135–146)
TOTAL PROTEIN: 7 g/dL (ref 6.1–8.1)

## 2015-05-23 LAB — LIPID PANEL
CHOLESTEROL: 148 mg/dL (ref 125–200)
HDL: 54 mg/dL (ref >=46)
LDL CALC: 83 mg/dL (ref <130)
Total CHOL/HDL Ratio: 2.7 Ratio (ref <=5.0)
Triglycerides: 55 mg/dL (ref <150)
VLDL: 11 mg/dL (ref <30)

## 2015-05-23 LAB — CBC WITH DIFFERENTIAL/PLATELET
BASOS ABS: 0.1 10*3/uL (ref 0.0–0.1)
Basophils Relative: 1 % (ref 0–1)
EOS ABS: 0.3 10*3/uL (ref 0.0–0.7)
EOS PCT: 4 % (ref 0–5)
HCT: 41.8 % (ref 36.0–46.0)
Hemoglobin: 13.8 g/dL (ref 12.0–15.0)
LYMPHS PCT: 28 % (ref 12–46)
Lymphs Abs: 1.9 10*3/uL (ref 0.7–4.0)
MCH: 30.9 pg (ref 26.0–34.0)
MCHC: 33 g/dL (ref 30.0–36.0)
MCV: 93.7 fL (ref 78.0–100.0)
MPV: 9.3 fL (ref 8.6–12.4)
Monocytes Absolute: 0.4 10*3/uL (ref 0.1–1.0)
Monocytes Relative: 6 % (ref 3–12)
NEUTROS PCT: 61 % (ref 43–77)
Neutro Abs: 4.1 10*3/uL (ref 1.7–7.7)
PLATELETS: 303 10*3/uL (ref 150–400)
RBC: 4.46 MIL/uL (ref 3.87–5.11)
RDW: 12.7 % (ref 11.5–15.5)
WBC: 6.7 10*3/uL (ref 4.0–10.5)

## 2015-05-23 LAB — TSH: TSH: 2.77 mIU/L

## 2015-05-24 LAB — CA 125: CA 125: 7 U/mL (ref <35)

## 2015-05-27 ENCOUNTER — Other Ambulatory Visit: Payer: Self-pay | Admitting: Gynecology

## 2015-05-27 DIAGNOSIS — R7989 Other specified abnormal findings of blood chemistry: Secondary | ICD-10-CM

## 2015-05-27 DIAGNOSIS — R945 Abnormal results of liver function studies: Secondary | ICD-10-CM

## 2015-05-27 DIAGNOSIS — E559 Vitamin D deficiency, unspecified: Secondary | ICD-10-CM

## 2015-05-28 ENCOUNTER — Other Ambulatory Visit: Payer: 59

## 2015-05-28 DIAGNOSIS — R945 Abnormal results of liver function studies: Secondary | ICD-10-CM

## 2015-05-28 DIAGNOSIS — E559 Vitamin D deficiency, unspecified: Secondary | ICD-10-CM

## 2015-05-28 DIAGNOSIS — R7989 Other specified abnormal findings of blood chemistry: Secondary | ICD-10-CM

## 2015-05-28 LAB — AST: AST: 22 U/L (ref 10–30)

## 2015-05-28 LAB — ALT: ALT: 29 U/L (ref 6–29)

## 2015-05-29 ENCOUNTER — Other Ambulatory Visit: Payer: Self-pay | Admitting: *Deleted

## 2015-05-29 ENCOUNTER — Encounter: Payer: Self-pay | Admitting: Gynecology

## 2015-05-29 DIAGNOSIS — E559 Vitamin D deficiency, unspecified: Secondary | ICD-10-CM

## 2015-05-29 LAB — VITAMIN D 25 HYDROXY (VIT D DEFICIENCY, FRACTURES): Vit D, 25-Hydroxy: 24 ng/mL — ABNORMAL LOW (ref 30–100)

## 2015-05-29 MED ORDER — VITAMIN D (ERGOCALCIFEROL) 1.25 MG (50000 UNIT) PO CAPS: ORAL_CAPSULE | ORAL | Status: DC

## 2015-06-07 ENCOUNTER — Encounter: Payer: Self-pay | Admitting: Gynecology

## 2015-06-07 ENCOUNTER — Ambulatory Visit (INDEPENDENT_AMBULATORY_CARE_PROVIDER_SITE_OTHER): Payer: 59 | Admitting: Gynecology

## 2015-06-07 VITALS — BP 128/76

## 2015-06-07 DIAGNOSIS — R896 Abnormal cytological findings in specimens from other organs, systems and tissues: Secondary | ICD-10-CM | POA: Diagnosis not present

## 2015-06-07 DIAGNOSIS — IMO0002 Reserved for concepts with insufficient information to code with codable children: Secondary | ICD-10-CM

## 2015-06-07 NOTE — Progress Notes (Signed)
   Patient is a 45 year old was seen in the office on February 2 for her annual gynecological examination and is here today for colposcopic evaluation as a result of 2 prior ASCUS with negative HPV in 2016 in 2017 respectively. 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 She has a Mirena IUD. She recently received Depo-Provera 150 mg IM as a result of left ovarian cyst. She had a normal CA 125.  Patient underwent a detail colposcopic evaluation of the external genitalia, perineum and perirectal region with no lesions seen. The speculum was introduced into the vagina and a systematic inspection of the vagina, fornix, ectocervix did not demonstrate any lesions. Endocervical speculum was utilized and the transformation zone was visualized and no lesions were seen. The IUD string was visualized. An ECC was obtained.  Assessment/plan: Patient in her 30s had dysplasia with cryotherapy was subsequent Pap smear normal until 2016 in 2017 whereby ASCUS negative HPV had been reported. Patient underwent detail colposcopic evaluation today no abnormality noted. An ECC was obtained. If normal will follow-up with Pap smear in one year. Patient scheduled to return back in 3 months for follow-up ultrasound. All the above instructions were provided in Spanish.

## 2015-06-07 NOTE — Patient Instructions (Signed)
Colposcopa - Cuidados posteriores (Colposcopy, Care After) Siga estas instrucciones durante las prximas semanas. Estas indicaciones le proporcionan informacin general acerca de cmo deber cuidarse despus del procedimiento. El mdico tambin podr darle instrucciones ms especficas. El tratamiento se ha planificado de acuerdo a las prcticas mdicas actuales, pero a veces se producen problemas. Comunquese con el mdico si tiene algn problema o tiene dudas despus del procedimiento. QU ESPERAR DESPUS DEL PROCEDIMIENTO  Despus del procedimiento, es tpico tener las siguientes sensaciones:  Clicos. Generalmente se calman en algunos minutos.  Dolor. Puede durar hasta dos das.  Aturdimiento. Si esto le ocurre, recustese durante algunos minutos. Podr tener un sangrado leve o una secrecin oscura que debe detenerse en algunos das. Durante este tiempo deber usar un apsito sanitario. INSTRUCCIONES PARA EL CUIDADO EN EL HOGAR  Evite las relaciones sexuales, las duchas vaginales y el uso de tampones durante 3 das, o segn lo que le indique su mdico.  Tome slo medicamentos de venta libre o recetados, segn las indicaciones del mdico. No tome aspirina, ya que puede causar hemorragias.  Si utiliza pldoras anticonceptivas, contine tomndolas.  No todos los resultados estarn disponibles durante su visita. En este caso, tenga otra entrevista con su mdico para conocerlos. No suponga que es normal si no tiene noticias de su mdico o del establecimiento de salud. Es importante el seguimiento de todos los resultados de los estudios.  Siga los consejos de su mdico con respecto a los medicamentos, actividades, visitas y [place] de control. SOLICITE ATENCIN MDICA SI:  Aparece una erupcin cutnea.  Tiene problemas con los medicamentos. SOLICITE ATENCIN MDICA DE INMEDIATO SI:  Tiene una hemorragia abundante o elimina cogulos.  Tiene fiebre.  Tiene flujo vaginal  anormal.  Tiene clicos que no se alivian luego de tomar analgsicos.  Se siente mareada, tiene vahdos o se desmaya.  Siente dolor en el estmago.   Esta informacin no tiene como fin reemplazar el consejo del mdico. Asegrese de hacerle al mdico cualquier pregunta que tenga.   Document Released: 01/18/2013 Elsevier Interactive Patient Education 2016 Elsevier Inc.  

## 2015-06-10 ENCOUNTER — Ambulatory Visit: Payer: Self-pay

## 2015-06-14 ENCOUNTER — Telehealth: Payer: Self-pay | Admitting: Anesthesiology

## 2015-06-14 ENCOUNTER — Ambulatory Visit: Payer: Self-pay

## 2015-06-14 NOTE — Telephone Encounter (Signed)
Dr. Lily PeerFernandez I called the patient to inform of path results and after explaining the procedure (LEEP) she opted not to have it in the office. She gets very anxious and would prefer to have it done under anesthesia. Please advise.

## 2015-06-16 NOTE — Telephone Encounter (Signed)
Please let Carolyn MessierKathy know that I can operate onnher after second week in April. [facility]. I would need to see her for preop few days prior.

## 2015-06-18 ENCOUNTER — Telehealth: Payer: Self-pay

## 2015-06-18 NOTE — Telephone Encounter (Signed)
I called patient because she preferred to have surgery w general anesthesia versus LEEP in office. Dr. Glenetta HewJF had sent me a note for [facility].  I discussed ins benefits with patient and her estimated surgery prepayment amount due prior to surgery. Financial letter will be sent.  She [facility] Appt desk will be calling her to arrange a pre op appt with Dr. Glenetta HewJF.  Patient asked about how much time out of work. 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 Please advise?

## 2015-06-18 NOTE — Telephone Encounter (Signed)
Usually after a cone the patient is back to work in a day or 2. If she is doing a lot of heavy lifting then she may want to take a couple extra days.

## 2015-06-19 ENCOUNTER — Encounter: Payer: Self-pay | Admitting: Gynecology

## 2015-06-19 ENCOUNTER — Telehealth: Payer: Self-pay

## 2015-06-19 NOTE — Telephone Encounter (Signed)
I had spoken with patient yesterday and scheduled her [facility] .  She called back today because after discussing with her employer she needs to change the date. I r/s her to 08/20/15 at her request. Pre op appt was scheduled as well. I discussed ins benefits and her estimated surgery prepayment that will be due.  Today patient mentioned to me that for two weeks she has been feeling bad with chills and fatigue. She really notices the chills at night makes it hard to sleep.  She said she is very tired and went to bed at 6:45pm last night. She was asking if we could run blood work to see if infection. I asked her if she had PCP she could see but she does not. Upon further conversation she mentioned that she feels a pinching sensation with urination and sometimes feels like she cannot empty her bladder all at once.  She spoke with Debarah Crapelaudia to see if we could schedule her to be seen in the office. Debarah CrapeClaudia said she declined the appts that she offered her for this week and ultimately scheduled an appt for next week. I had discussed with her that she could go to Urgent Care and just walk-in today after her work and they would be able to see her to evaluate.

## 2015-06-19 NOTE — Telephone Encounter (Signed)
Patient advised.

## 2015-06-25 ENCOUNTER — Ambulatory Visit (INDEPENDENT_AMBULATORY_CARE_PROVIDER_SITE_OTHER): Payer: 59 | Admitting: Women's Health

## 2015-06-25 ENCOUNTER — Encounter: Payer: Self-pay | Admitting: Women's Health

## 2015-06-25 VITALS — BP 104/70

## 2015-06-25 DIAGNOSIS — R5383 Other fatigue: Secondary | ICD-10-CM

## 2015-06-25 DIAGNOSIS — N898 Other specified noninflammatory disorders of vagina: Secondary | ICD-10-CM

## 2015-06-25 DIAGNOSIS — R300 Dysuria: Secondary | ICD-10-CM | POA: Diagnosis not present

## 2015-06-25 DIAGNOSIS — R5381 Other malaise: Secondary | ICD-10-CM | POA: Diagnosis not present

## 2015-06-25 LAB — CBC WITH DIFFERENTIAL/PLATELET
BASOS ABS: 0.1 10*3/uL (ref 0.0–0.1)
Basophils Relative: 1 % (ref 0–1)
EOS PCT: 3 % (ref 0–5)
Eosinophils Absolute: 0.2 10*3/uL (ref 0.0–0.7)
HEMATOCRIT: 44.9 % (ref 36.0–46.0)
HEMOGLOBIN: 15 g/dL (ref 12.0–15.0)
LYMPHS ABS: 2.5 10*3/uL (ref 0.7–4.0)
LYMPHS PCT: 39 % (ref 12–46)
MCH: 31.2 pg (ref 26.0–34.0)
MCHC: 33.4 g/dL (ref 30.0–36.0)
MCV: 93.3 fL (ref 78.0–100.0)
MPV: 9.3 fL (ref 8.6–12.4)
Monocytes Absolute: 0.4 10*3/uL (ref 0.1–1.0)
Monocytes Relative: 7 % (ref 3–12)
NEUTROS ABS: 3.2 10*3/uL (ref 1.7–7.7)
Neutrophils Relative %: 50 % (ref 43–77)
Platelets: 304 10*3/uL (ref 150–400)
RBC: 4.81 MIL/uL (ref 3.87–5.11)
RDW: 12.7 % (ref 11.5–15.5)
WBC: 6.3 10*3/uL (ref 4.0–10.5)

## 2015-06-25 LAB — COMPREHENSIVE METABOLIC PANEL
ALT: 15 U/L (ref 6–29)
AST: 16 U/L (ref 10–30)
Albumin: 4.6 g/dL (ref 3.6–5.1)
Alkaline Phosphatase: 56 U/L (ref 33–115)
BILIRUBIN TOTAL: 0.5 mg/dL (ref 0.2–1.2)
BUN: 12 mg/dL (ref 7–25)
CALCIUM: 9.7 mg/dL (ref 8.6–10.2)
CHLORIDE: 102 mmol/L (ref 98–110)
CO2: 23 mmol/L (ref 20–31)
Creat: 0.75 mg/dL (ref 0.50–1.10)
GLUCOSE: 89 mg/dL (ref 65–99)
Potassium: 3.9 mmol/L (ref 3.5–5.3)
Sodium: 137 mmol/L (ref 135–146)
Total Protein: 7.4 g/dL (ref 6.1–8.1)

## 2015-06-25 LAB — URINALYSIS W MICROSCOPIC + REFLEX CULTURE
Bacteria, UA: NONE SEEN [HPF]
Bilirubin Urine: NEGATIVE
CASTS: NONE SEEN [LPF]
CRYSTALS: NONE SEEN [HPF]
Glucose, UA: NEGATIVE
Ketones, ur: NEGATIVE
Leukocytes, UA: NEGATIVE
NITRITE: NEGATIVE
PROTEIN: NEGATIVE
SPECIFIC GRAVITY, URINE: 1.01 (ref 1.001–1.035)
WBC, UA: NONE SEEN WBC/HPF (ref <=5)
YEAST: NONE SEEN [HPF]
pH: 5 (ref 5.0–8.0)

## 2015-06-25 LAB — WET PREP FOR TRICH, YEAST, CLUE
Clue Cells Wet Prep HPF POC: NONE SEEN
TRICH WET PREP: NONE SEEN
Yeast Wet Prep HPF POC: NONE SEEN

## 2015-06-25 LAB — TSH: TSH: 3.32 mIU/L

## 2015-06-25 NOTE — Progress Notes (Signed)
Presents with a 3 week history of subjective fever, chills, fatigue, occasional feeling of incomplete urinary voiding/dysuria/frequency, and white/yellow vaginal discharge with odor. Denies nocturia, vaginal bleeding, hematuria, or abdominal pain. Long history of abnormal Paps and colposcopy-LEEP scheduled 08/2015. Divorced, no worries about infidelity, no new partners since, declines need for STD screen. Amenorrhea on rare spotting with Mirena IUD.  Exam: Appears well. Very mild erythema over the labia and introitus. Speculum exam: No discharge or odor noted. Cervix normal, IUD strings visible. No CMT, adnexal tenderness, or suprapubic tenderness. Wet prep negative UA: 2+ blood, 3-10 RBCs, negative WBCs/bacteria  Malaise/fatigue Dysuria with frequency Vaginal discharge  Plan: reviewed normality of exam and wet prep. Recommended taking time for self and continued help from her children with chores around the house. TSH, CBC, CMP, urine culture pending. Instructed to call if symptoms persist. Keep scheduled LEEP appointment with Dr. Lily PeerFernandez.

## 2015-06-28 ENCOUNTER — Ambulatory Visit
Admission: RE | Admit: 2015-06-28 | Discharge: 2015-06-28 | Disposition: A | Discharge status: Home / Self Care | Payer: 59 | Source: Ambulatory Visit

## 2015-06-28 DIAGNOSIS — Z1231 Encounter for screening mammogram for malignant neoplasm of breast: Secondary | ICD-10-CM

## 2015-06-28 LAB — URINE CULTURE: Colony Count: 50000

## 2015-07-03 ENCOUNTER — Telehealth: Payer: Self-pay

## 2015-07-03 NOTE — Telephone Encounter (Signed)
Patient called because she did not hear from us regarding her lab results from last week. Before I tell her all normal I wanted to check with you about her urine culture result.

## 2015-07-03 NOTE — Telephone Encounter (Signed)
Please call and inform all labs normal. (true UTI  > 100,000 colonies on culture or if she has symptoms?)

## 2015-07-04 NOTE — Telephone Encounter (Signed)
Patient informed. Patient asked about rescheduling her surgery due to issues with her job. She asked to r/s to May 25 and I moved her surgery there and r/s her consult appointment.

## 2015-07-10 ENCOUNTER — Other Ambulatory Visit: Payer: Self-pay | Admitting: Gynecology

## 2015-07-10 DIAGNOSIS — R928 Other abnormal and inconclusive findings on diagnostic imaging of breast: Secondary | ICD-10-CM

## 2015-07-17 ENCOUNTER — Other Ambulatory Visit: Payer: 59

## 2015-07-31 ENCOUNTER — Encounter (HOSPITAL_COMMUNITY): Payer: Self-pay | Admitting: *Deleted

## 2015-08-01 ENCOUNTER — Other Ambulatory Visit: Payer: 59

## 2015-08-15 ENCOUNTER — Institutional Professional Consult (permissible substitution): Payer: 59 | Admitting: Gynecology

## 2015-08-20 ENCOUNTER — Ambulatory Visit
Admission: RE | Admit: 2015-08-20 | Discharge: 2015-08-20 | Disposition: A | Discharge status: Home / Self Care | Payer: 59 | Source: Ambulatory Visit | Attending: Gynecology | Admitting: Gynecology

## 2015-08-20 DIAGNOSIS — R928 Other abnormal and inconclusive findings on diagnostic imaging of breast: Secondary | ICD-10-CM

## 2015-08-23 ENCOUNTER — Other Ambulatory Visit: Payer: 59

## 2015-08-23 ENCOUNTER — Ambulatory Visit: Payer: 59 | Admitting: Gynecology

## 2015-08-26 ENCOUNTER — Ambulatory Visit (INDEPENDENT_AMBULATORY_CARE_PROVIDER_SITE_OTHER): Payer: 59 | Admitting: Gynecology

## 2015-08-26 ENCOUNTER — Encounter: Payer: Self-pay | Admitting: Gynecology

## 2015-08-26 VITALS — BP 118/72

## 2015-08-26 DIAGNOSIS — R896 Abnormal cytological findings in specimens from other organs, systems and tissues: Secondary | ICD-10-CM

## 2015-08-26 DIAGNOSIS — R85619 Unspecified abnormal cytological findings in specimens from anus: Secondary | ICD-10-CM

## 2015-08-26 NOTE — Progress Notes (Signed)
   ZOX:[id number]:Patient is a 45 year old was seen in the office on February 2 for her annual gynecological examination and is here today for colposcopic evaluation as a result of 2 prior ASCUS with negative HPV in 2016 in 2017 respectively. 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 She has a Mirena IUD. She recently received Depo-Provera 150 mg IM as a result of left ovarian cyst. She had a normal CA 125.  Patient underwent a detail colposcopic evaluation of the external genitalia, perineum and perirectal region with no lesions seen. The speculum was introduced into the vagina and a systematic inspection of the vagina, fornix, ectocervix did not demonstrate any lesions. Endocervical speculum was utilized and the transformation zone was visualized and no lesions were seen. The IUD string was visualized. An ECC was obtained.  Her pathology report demonstrated the following: Diagnosis Endocervix, curettage - MINUTE DETACHED FRAGMENTS OF SQUAMOUS MUCOSA WITH ATYPIA, SEE COMMENT. Microscopic Comment The background shows benign endocervical mucosa. In this background, there are small detached fragments of primarily metaplastic appearing mucosa displaying epithelial atypia in the form of nuclear enlargement and hyperchromasia. The changes are concerning for a squamous dysplastic process but the findings are limited and hence clinical correlation is strongly recommended. (BNS:ecj 06/10/2015  ROS: A ROS was performed and pertinent positives and negatives are included in the history.  GENERAL: No fevers or chills. HEENT: No change in vision, no earache, sore throat or sinus congestion. NECK: No pain or stiffness. CARDIOVASCULAR: No chest pain or pressure. No palpitations. PULMONARY: No shortness of breath, cough or wheeze. GASTROINTESTINAL: No abdominal pain, nausea,  vomiting or diarrhea, melena or bright red blood per rectum. GENITOURINARY: No urinary frequency, urgency, hesitancy or dysuria. MUSCULOSKELETAL: No joint or muscle pain, no back pain, no recent trauma. DERMATOLOGIC: No rash, no itching, no lesions. ENDOCRINE: No polyuria, polydipsia, no heat or cold intolerance. No recent change in weight. HEMATOLOGICAL: No anemia or easy bruising or bleeding. NEUROLOGIC: No headache, seizures, numbness, tingling or weakness. PSYCHIATRIC: No depression, no loss of interest in normal activity or change in sleep pattern.    Assessment Plan:Pathology report was discussed with the patient since this was a small fragment of T Thad squamous mucosa with atypia we are going to do the LEEP cone here in the office next week. Risk benefits and pros and cons were discussed as well as literature information was provided in [place]. All questions are answered.    Greater than 50% of time was spent in counseling and coordinating care of this patient.   Time of consultation: 10   Minutes.                .Marland Kitchen

## 2015-08-26 NOTE — Patient Instructions (Signed)
Conizacin cervical (Conization of the Cervix) La conizacin es el corte (escisin) de una porcin del cuello del tero en forma de cono. El procedimiento se realiza a travs de la vagina, en el consultorio mdico o en una sala de operaciones. Este procedimiento se realiza generalmente cuando hay una hemorragia anormal que proviene del cuello del tero. Tambin se realiza para evaluar un Papanicolau anormal o anormalidades observadas en el cuello durante un examen. El tejido se examina bajo para determinar si hay clulas precancerosas o cncer.  Este procedimiento no se realiza durante el periodo menstrual o el embarazo.  INFORME A SU MDICO:  Cualquier alergia que tenga.   Todos los medicamentos que utiliza, incluyendo vitaminas, hierbas, gotas oftlmicas, cremas y medicamentos de venta libre.   Problemas previos que usted o los miembros de su familia hayan tenido con el uso de anestsicos.   Enfermedades de la sangre.   Cirugas previas.   Padecimientos mdicos.   Su hbito de fumar.   Si existe un posible embarazo.  RIESGOS Y COMPLICACIONES  Generalmente, la conizacin del cuello uterino es un procedimiento seguro. Sin embargo, como en cualquier procedimiento, pueden surgir complicaciones. Las complicaciones posibles son:  Sufrir un sangrado durante algunos das o semanas despus del procedimiento. Un ligero sangrado o manchado luego del procedimiento es normal.  Infeccin (raro).  Lesin en el cuello uterino o en los rganos que lo rodean (poco frecuente).   Problemas con la anestesia.   Aumento del riesgo de parto prematuro en futuros embarazos. ANTES DEL PROCEDIMIENTO  No debe comer ni beber nada durante las 6 - 8 horas previas al examen.   No tome aspirina ni anticoagulantes durante la semana previa al procedimiento, o segn le hayan indicado.   Pdale a alguna persona que la lleve a su casa luego del procedimiento.  PROCEDIMIENTO Hay tres mtodos  diferentes para realizar la conizacin del cuello del tero. Ellos son:   Mtodo con bistur fro: es este mtodo se corta una pequea muestra de tejido con forma de cono con un bistur (escalpelo) en el canal cervical y en la zona de transformacin (dnde terminan las clulas normales y comienzan las anormales).   El mtodo LEEP: en este mtodo se corta una pequea muestra de tejido en forma de cono con un cable delgado que quema (cauteriza) el tejido del cuello uterino con corriente elctrica.   Tratamiento con lser: en este mtodo se corta una pequea muestra de tejido en forma de cono y luego se cauteriza con un rayo lser para evitar el sangrado.  El procedimiento se realizar como sigue:   Segn el mtodo, le administrarn medicamentos para hacerlo dormir (anestesia general) o medicamentos para adormecer la zona (anestesia local). Podrn administrarle un medicamento para adormecer el cuello del tero (boqueo cervical).   Se inserta un dispositivo lubricado llamado espculo dentro de la vagina para separar las paredes. Esto ayudar a que el mdico pueda ver mejor el interior de la vagina y el cuello del tero.   El tejido se extirpar y ser examinado.   Los resultados sel procedimiento ayudarn al mdico a decidir si es necesario ampliar el tratamiento. Tambin ayudar al mdico a decidir el mejor tratamiento, si el resultado no es normal. DESPUS DEL PROCEDIMIENTO  Si le han administrado anestesia general se sentir mareada durante 2-3 horas despus del procedimiento.   Si le han aplicado un anestsico local, le indicarn que haga reposo en el centro quirrgico o en el consultorio del mdico hasta que se   encuentre estable y se sienta lista por volver a su casa.   La recuperacin puede demorar hasta 3 semanas.   Podr sentir algunos clicos durante alrededor de 1 semana.   El sangrado o la hemorragia vaginal pueden durar entre 2 y 4 semanas.   Puede observar una  secrecin de color negro que proviene de la vagina. Es la crema colocada en el cuello para evitar hemorragias. Esto es normal.    Esta informacin no tiene como fin reemplazar el consejo del mdico. Asegrese de hacerle al mdico cualquier pregunta que tenga.   Document Released: 01/07/2005 Document Revised: 11/30/2012 Elsevier Interactive Patient Education 2016 Elsevier Inc.  

## 2015-08-28 ENCOUNTER — Other Ambulatory Visit: Payer: 59

## 2015-08-28 ENCOUNTER — Ambulatory Visit: Payer: 59 | Admitting: Gynecology

## 2015-09-04 ENCOUNTER — Telehealth: Payer: Self-pay

## 2015-09-04 NOTE — Telephone Encounter (Signed)
I called patient back but she had questions about her bill. I assured her I will have someone from billing contact her. I forwarded the information to Sherrilyn RistKari.

## 2015-09-04 NOTE — Telephone Encounter (Signed)
I had voice mail message from patient asking me to call her that she had a question. I called and left message on her voice mail to call me.

## 2015-09-05 ENCOUNTER — Encounter: Payer: Self-pay | Admitting: Gynecology

## 2015-09-05 ENCOUNTER — Ambulatory Visit (HOSPITAL_COMMUNITY): Admission: RE | Admit: 2015-09-05 | Payer: 59 | Source: Ambulatory Visit | Admitting: Gynecology

## 2015-09-05 ENCOUNTER — Ambulatory Visit (INDEPENDENT_AMBULATORY_CARE_PROVIDER_SITE_OTHER): Payer: 59 | Admitting: Gynecology

## 2015-09-05 VITALS — BP 126/78

## 2015-09-05 DIAGNOSIS — R87619 Unspecified abnormal cytological findings in specimens from cervix uteri: Secondary | ICD-10-CM | POA: Insufficient documentation

## 2015-09-05 HISTORY — DX: Headache, unspecified: R51.9

## 2015-09-05 HISTORY — DX: Headache: R51

## 2015-09-05 SURGERY — CONE BIOPSY, CERVIX
Anesthesia: General

## 2015-09-05 NOTE — Patient Instructions (Signed)
Procedimiento de escisin electroquirrgica con asa - Cuidados posteriores  (Loop Electrosurgical Excision Procedure, Care After) Siga estas instrucciones durante las prximas semanas. Estas indicaciones le proporcionan informacin general acerca de cmo deber cuidarse despus del procedimiento. El mdico tambin podr darle instrucciones especficas. El tratamiento ha sido planificado segn las prcticas mdicas actuales, pero en algunos casos pueden ocurrir problemas. Comunquese con el mdico si tiene algn problema o tiene preguntas despus del procedimiento.  INSTRUCCIONES PARA EL CUIDADO EN EL HOGAR   No use tampones, no se d duchas vaginales ni tenga relaciones sexuales durante 2 semanas, o segn lo que le indique su mdico.  Comience con las actividades habituales si no tiene o tiene mnimo de clicos y sangrado, excepto que el mdico le indique lo contrario.  Tmese la temperatura si se siente enfermo. Anote la temperatura en un papel e informe a su mdico que tiene fiebre.  Tome todos los medicamentos segn le indic su mdico.  Cumpla con todas las visitas de control y los papanicolau, segn le indique su mdico. SOLICITE ATENCIN MDICA DE INMEDIATO SI:   Tiene un sangrado ms abundante o que dura ms que el ciclo menstrual normal.  Tiene un sangrado de color rojo brillante.  Elimina cogulos de sangre.  Tiene fiebre.  Siente clicos o el dolor no se alivia con la medicacin.  Siente dolor abdominal que no parece estar relacionado con la misma zona en que sinti los clicos y el dolor.  Se siente mareada, dbil o se desmaya.  Comienza a sentir dolor al orinar u observa sangre.  Tiene una secrecin vaginal con mal olor. ASEGRESE DE QUE:   Comprende estas instrucciones.  Controlar su enfermedad.  Solicitar ayuda de inmediato si no mejora o si empeora.   Esta informacin no tiene como fin reemplazar el consejo del mdico. Asegrese de hacerle al mdico cualquier  pregunta que tenga.   Document Released: 12/11/2010 Document Revised: 06/22/2011 Elsevier Interactive Patient Education 2016 Elsevier Inc.  

## 2015-09-05 NOTE — Progress Notes (Signed)
Patient presented to the office today for LEEP cervical conization. Her history is as follows:  Patient is a 45 year old was seen in the office on February 2 for her annual gynecological examination and is here today for colposcopic evaluation as a result of 2 prior ASCUS with negative HPV in 2016 in 2017 respectively. 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 She has a Mirena IUD. She recently received Depo-Provera 150 mg IM as a result of left ovarian cyst. She had a normal CA 125.  Patient underwent a detail colposcopic evaluation of the external genitalia, perineum and perirectal region with no lesions seen. The speculum was introduced into the vagina and a systematic inspection of the vagina, fornix, ectocervix did not demonstrate any lesions. Endocervical speculum was utilized and the transformation zone was visualized and no lesions were seen. The IUD string was visualized. An ECC was obtained.  Her pathology report demonstrated the following: Diagnosis Endocervix, curettage - MINUTE DETACHED FRAGMENTS OF SQUAMOUS MUCOSA WITH ATYPIA, SEE COMMENT. Microscopic Comment The background shows benign endocervical mucosa. In this background, there are small detached fragments of primarily metaplastic appearing mucosa displaying epithelial atypia in the form of nuclear enlargement and hyperchromasia. The changes are concerning for a squamous dysplastic process but the findings are limited and hence clinical correlation is strongly recommended. (BNS:ecj 06/10/2015  LEEP (Leep electrosurgical excision procedure)    Patient Name:Carolyn Fletcher  Record [id number]  Indication For Surgery: Positive ECC  Surgeon: Reynaldo Minium H  Anesthesia: 2 % lidocaine paracervical block   Procedure:  LEEP (Loop electrosurgical excision  procedure) Description of Operation:  After the patient was verbally counseled the patient was placed in the low lithotomy position.  A coated speculum was inserted into the vagina and colposcopic examination was performed with 4% acidic acid with findings noted above.  The cervix was then painted with Lugol's solution to delineate the margins of the lesion and transformation zone.  Approximately 5 cc's of 2% xylocaine with epinephrine was infiltrated deep near the outer margin of the transformation zone circumferentially at 12, 3, 6, and 9 o'clock positions.  [facility] Electrosurgical Generator was then turned on after the patient was grounded with pad electrode on her thigh and jewelry removed.  The settings on the generator were Blend 1 current 70 watts cut and 70 watts on the coagulation mode.  A size 20 x 12 loop electrode was utilized to exercise the atypical transformation zone.  The tip of the electrode was placed 3 mm from the edge of the lesion at 3, 6, 9 and 12 o'clock position.  The electrode was moved slowly over the lesion was within the loop limits.  A vaginal wall retractor was used.  The loop was then repositioned and the finger switch on the hand held piece was activated ( or footpedal depressed).  A slight pressure on the shaft was applied and the loop was extended into the tissue up to its crossbar to a depth of 3 mm, then with steady, slow motion across and underneath the endocervical button was not excised.  The loop electrode was replaced with ball electrode set at 50 watts and the base of the crater was fulgurated circumferentially.  Monsell's paint was then applied for additional hemostasis.  A suture was placed at 12 o'clock position of cervical biopsy specimen  for orientation, and placed in formation fixative for pathology evaluation.  Patient tolerated the procedure well with minimal blood loss and without any complications.  After the procedure patient left office with  stable vital signs and instructions sheet.   [facility]ookFERNANDEZ,JUAN HMD10:00 AMTD@

## 2015-09-06 ENCOUNTER — Telehealth: Payer: Self-pay

## 2015-09-06 ENCOUNTER — Other Ambulatory Visit: Payer: Self-pay | Admitting: Gynecology

## 2015-09-06 MED ORDER — CLINDAMYCIN PHOSPHATE 2 % VA CREA: 1.0000 | TOPICAL_CREAM | Freq: Every day | VAGINAL | Status: DC

## 2015-09-06 NOTE — Telephone Encounter (Signed)
#  1 Patient was prescribed Vit D in Feb and was to return in 12 weeks for lab. She said she has not taken them regularly. She has missed some weeks. Still has 4 pills left. She asked if she should refill them and take correctly and then check or finish what she has and come on for lab?  #2  Patient is post op LEEP in office yesterday morning. She is having cramping and she said it is not constant and feels better when off her feet. Like menstrual cramps. She used Ibuprofen last night. She wanted to make sure this sounds normal.   #3  Patient said you told her you were going to prescribe a cream but it was not at her pharmacy.

## 2015-09-06 NOTE — Telephone Encounter (Signed)
Tell her to finish her for vitamin D tablets as prescribed one weekly. When she comes for her postop appointment we will check her vitamin D level. When she patient states 4 tablets reminder to take vitamin D3 2000 units every day. Call in prescription for Cleocin vaginal cream to apply daily at bedtime for one week at bedtime starting Sunday. The cramping is not unusual she can continue to take the ibuprofen when necessary. Please reassure her. Thank you

## 2015-09-06 NOTE — Telephone Encounter (Signed)
[facility]ailed [facility] Rx sent and vit d order placed for when she comes for post op visit.

## 2015-09-12 ENCOUNTER — Encounter: Payer: Self-pay | Admitting: Gynecology

## 2015-09-12 ENCOUNTER — Telehealth: Payer: Self-pay

## 2015-09-12 ENCOUNTER — Ambulatory Visit (INDEPENDENT_AMBULATORY_CARE_PROVIDER_SITE_OTHER): Payer: 59 | Admitting: Gynecology

## 2015-09-12 VITALS — BP 124/78 | Temp 98.1°F | Ht 64.0 in | Wt 123.0 lb

## 2015-09-12 DIAGNOSIS — N390 Urinary tract infection, site not specified: Secondary | ICD-10-CM | POA: Diagnosis not present

## 2015-09-12 DIAGNOSIS — R350 Frequency of micturition: Secondary | ICD-10-CM

## 2015-09-12 DIAGNOSIS — R1030 Lower abdominal pain, unspecified: Secondary | ICD-10-CM

## 2015-09-12 DIAGNOSIS — IMO0001 Reserved for inherently not codable concepts without codable children: Secondary | ICD-10-CM

## 2015-09-12 DIAGNOSIS — R6883 Chills (without fever): Secondary | ICD-10-CM

## 2015-09-12 LAB — URINALYSIS W MICROSCOPIC + REFLEX CULTURE
BILIRUBIN URINE: NEGATIVE
CASTS: NONE SEEN [LPF]
CRYSTALS: NONE SEEN [HPF]
GLUCOSE, UA: NEGATIVE
Ketones, ur: NEGATIVE
Nitrite: NEGATIVE
PROTEIN: NEGATIVE
Specific Gravity, Urine: <1.005 (ref 1.001–1.035)
Yeast: NONE SEEN [HPF]
pH: 5.5 (ref 5.0–8.0)

## 2015-09-12 LAB — WET PREP FOR TRICH, YEAST, CLUE
Clue Cells Wet Prep HPF POC: NONE SEEN
TRICH WET PREP: NONE SEEN
Yeast Wet Prep HPF POC: NONE SEEN

## 2015-09-12 MED ORDER — CEPHALEXIN 500 MG PO CAPS: 500.0000 mg | ORAL_CAPSULE | Freq: Two times a day (BID) | ORAL | Status: DC

## 2015-09-12 MED ORDER — FLUCONAZOLE 150 MG PO TABS: 150.0000 mg | ORAL_TABLET | Freq: Once | ORAL | Status: DC

## 2015-09-12 NOTE — Progress Notes (Signed)
   HPI: Patient is a [age] year old who presented to the office today complaining some chills and reporting a temperature 103 at home last p.m. She is status post LEEP cervical conization on 09/05/2015 here in the office as a result of 2 prior ASCUS with negative HPV in 2016 in 2017 respectively. 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   ROS: A ROS was performed and pertinent positives and negatives are included in the history.  GENERAL: No fevers or chills. HEENT: No change in vision, no earache, sore throat or sinus congestion. NECK: No pain or stiffness. CARDIOVASCULAR: No chest pain or pressure. No palpitations. PULMONARY: No shortness of breath, cough or wheeze. GASTROINTESTINAL: No abdominal pain, nausea, vomiting or diarrhea, melena or bright red blood per rectum. GENITOURINARY: No urinary frequency, urgency, hesitancy or dysuria. MUSCULOSKELETAL: No joint or muscle pain, no back pain, no recent trauma. DERMATOLOGIC: No rash, no itching, no lesions. ENDOCRINE: No polyuria, polydipsia, no heat or cold intolerance. No recent change in weight. HEMATOLOGICAL: No anemia or easy bruising or bleeding. NEUROLOGIC: No headache, seizures, numbness, tingling or weakness. PSYCHIATRIC: No depression, no loss of interest in normal activity or change in sleep pattern.   PE: Temperature 98.1 Gen. appearance well-developed well-nourished female with the above-mentioned complaint. Heart: Regular rate and rhythm no murmurs gallops Lungs: Clear to auscultation Rogers or wheezes Abdomen: Soft nontender no rebound or guarding Pelvic: Bartholin urethra Skene was within normal limits Vagina: No lesions or discharge Cervix: Cervical bed healing no purulent discharge noted no friability Bimanual exam uterus upper limits of normal slightly tender suprapubically Adnexa: No  pedal mass or tenderness Rectal exam not done  Wet prep tumors to count white blood cell, moderate bacteria   Urinalysis: 10-20 RBC, few bacteria, 610-10 WBC  Assessment/plan: Patient with apparent urinary tract infection had chills and low-grade temperature last night in the event of a possible endometritis as well with point to cover her with Keflex 500 mg one by mouth twice a day for 10 days she will finish her clindamycin vaginal cream which I prescribed before daily at bedtime for 7 days she has 3 more days left. Also she'll be prescribed Diflucan 150 mg to take 1 by mouth because of her slight pruritus. Patient scheduled to return back to the office in 2 weeks for final postop visit I have also recommended she take Tylenol 1-2 tablets every 6-8 hours if any temperature elevation. If no improvement 70 2R she'll contact the office.

## 2015-09-12 NOTE — Patient Instructions (Signed)
Fluconazole tablets Qu es este medicamento? El FLUCONAZOL es un medicamento antimictico. Se utiliza para tratar ciertos tipos de infecciones micticas o por levadura. Este medicamento puede ser utilizado para otros usos; si tiene alguna pregunta consulte con su proveedor de atencin mdica o con su farmacutico. Qu le debo informar a mi profesional de la salud antes de tomar este medicamento? Necesita saber si usted presenta alguno de los siguientes problemas o situaciones: -antecedentes de pulso cardaco irregular -enfermedad renal -una reaccin alrgica o inusual al fluconazol, a otros azoles u otros medicamentos, alimentos, colorantes o conservantes -si est embarazada o buscando quedar embarazada -si est amamantando a un beb Cmo debo utilizar este medicamento? Tome este medicamento por va oral. Siga las instrucciones de la etiqueta del [place]. No tome su medicamento con una frecuencia mayor a la indicada. Hable con su pediatra para informarse acerca del uso de este medicamento en nios. Puede requerir atencin especial. Este medicamento ha sido recetado a nios tan menores como de 6 meses de [place]. Sobredosis: Pngase en contacto inmediatamente con un centro toxicolgico o una sala de urgencia si usted cree que haya tomado demasiado medicamento. ATENCIN: [employer] es solo para usted. No comparta este medicamento con nadie. Qu sucede si me olvido de una dosis? Si olvida una dosis, tmela lo antes posible. Si es casi la hora de la prxima dosis, tome slo esa dosis. No tome dosis adicionales o dobles. Qu puede interactuar con este medicamento? No tome esta medicina con ninguno de los siguientes medicamentos: -astemizol -ciertos medicamentos para el pulso cardiaco irregular, tales como dofetilida, dronedarona, quinidina -cisapride -eritromicina -lomitapida -otros medicamentos que prolongan el intervalo QT (causa un ritmo cardiaco  anormal) -pimozida -terfenadina -tioridazina -tolvaptn -ziprasidona Esta medicina tambin puede interactuar con los siguientes medicamentos: -medicamentos antivirales para el VIH o SIDA -pldoras anticonceptivas -ciertos antibiticos, tales como rifabutina, rifampicina -ciertos medicamentos para la presin sangunea, tales como amlodipina, isradipina, felodipino, hidroclorotiazida, losartn, nifedipina -ciertos medicamentos para el cncer, tales como ciclofosfamida, vinblastina, vincristina -ciertos medicamentos para el colesterol, tales como atorvastatina, lovastatina, fluvastatina, simvastatina -ciertos medicamentos para la depresin, ansiedad o trastornos psicticos, tales como amitriptilina, midazolam, nortriptilina, triazolam -ciertos medicamentos para la diabetes, tales como glipizida, gliburida, tolbutamida -ciertos medicamentos para Conservation officer, historic buildings, tales como alfentanilo, fentanilo, metadona -ciertos medicamentos para convulsiones, tales como carbamazepina, fenitona -ciertos medicamentos que tratan o previenen cogulos sanguneos, tales como warfarina -halofantrina -medicamentos que reducen su capacidad de combatir infecciones, tales como ciclosporina, prednisona, tacrolimo -los AINE, medicamentos para el dolor o inflamacin, tales como celecoxib, diclofenaco, flurbiprofeno, ibuprofeno, meloxicam, naproxeno -otros medicamentos para infecciones micticas -sirolims -teofilina -tofacitinib Puede ser que esta lista no menciona todas las posibles interacciones. Informe a su profesional de [employer]	[place] de [employer] productos a base de hierbas, medicamentos de [place] o suplementos nutritivos que est tomando. Si usted fuma, consume bebidas alcohlicas o si utiliza drogas ilegales, indqueselo tambin a su profesional de [employer]	[place]. Algunas sustancias pueden interactuar con su medicamento. A qu debo estar atento al usar [employer]? Visite a su mdico o a su profesional de la salud para  chequear su evolucin peridicamente. Si toma este medicamento durante un perodo de [employer], podr Customer service manager anlisis de [place]. Si los sntomas no mejoran, consulte a su mdico. Pueden transcurrir [employer] o meses de tratamiento antes de que algunas infecciones micticas se curen. El alcohol puede aumentar la posibilidad de daos al hgado. Evite consumir bebidas alcohlicas. Si tiene una infeccin vaginal, no tenga Radio broadcast assistant que [place]  completado el tratamiento. Puede usar una toallita higinica. No use tampones. Use ropa interior de algodn no sintticos, y recin lavadas. Qu efectos secundarios puedo tener al [employer] este medicamento? Efectos secundarios que debe informar a su mdico o a Producer, television/film/video de la salud tan pronto como sea posible: -Therapist, art como erupcin cutnea, picazn o urticarias, hinchazn de los labios, boca, lengua o garganta -orina de color amarillo oscuro -sensacin de mareos o desmayos -pulso cardiaco irregular o dolor en el pecho -enrojecimiento, formacin de ampollas, descamacin o distensin de la piel, inclusive dentro de la boca -dificultad al respirar -sangrado o magulladuras inusuales -vmito -color amarillento de los ojos o la piel Efectos secundarios que, por lo general, no requieren Psychologist, prison and probation services (debe informarlos a su mdico o a Producer, television/film/video de la salud si persisten o si son molestos): -cambios en el sabor de los alimentos -diarrea -dolor de cabeza -Programme researcher, broadcasting/film/video o nuseas Puede ser que esta lista no menciona todos los posibles efectos secundarios. Comunquese a su mdico por asesoramiento mdico [employer]. Usted puede informar los efectos secundarios a la FDA por telfono al 1-800-FDA-1088. Dnde debo guardar mi medicina? Mantngala fuera del alcance de los nios. Gurdela a [employer], a menos de 30 grados C (86 grados F). Deseche todo el medicamento que no  haya utilizado, despus de la fecha de vencimiento. ATENCIN: Este folleto es un resumen. Puede ser que no cubra toda la posible informacin. Si usted tiene preguntas acerca de esta medicina, consulte con su mdico, su farmacutico o su profesional de Radiographer, therapeutic.    2016, Elsevier/Gold Standard. (2014-05-23 00:00:00) Cephalexin tablets or capsules Qu es este medicamento? La CEFALEXINA es un antibitico cefalospornico. Se utiliza en el tratamiento de ciertos tipos de infecciones bacterianas. No es efectivo para resfros, gripe u otras infecciones de origen viral. Este medicamento puede ser utilizado para otros usos; si tiene alguna pregunta consulte con su proveedor de atencin mdica o con su farmacutico. Qu le debo informar a mi profesional de la salud antes de tomar este medicamento? Necesita saber si usted presenta alguno de los siguientes problemas o situaciones: -enfermedad renal -problemas estomacales o intestinales, especialmente colitis -una reaccin alrgica o inusual a la cefalexina, otros cefalospornicos, penicilinas, otros antibiticos, medicamentos, alimentos, colorantes o conservadores -si est embarazada o buscando quedar embarazada -si est amamantando a un beb Cmo debo utilizar este medicamento? Tome este medicamento por va oral con un vaso lleno de agua. Siga las instrucciones de la etiqueta del [place]. Este medicamento se puede tomar con o sin alimentos. Tome sus dosis a intervalos regulares. No tome su medicamento con una frecuencia mayor que la indicada. Complete todo el tratamiento con el medicamento segn lo haya recetado su mdico o su profesional de la salud aun si considera que su problema ha mejorado. Hable con su pediatra para informarse acerca del uso de este medicamento en nios. Puede requerir atencin especial. Sobredosis: Pngase en contacto inmediatamente con un centro toxicolgico o una sala de urgencia si usted cree que haya tomado demasiado  medicamento. ATENCIN: [employer] es solo para usted. No comparta este medicamento con nadie. Qu sucede si me olvido de una dosis? Si olvida una dosis, tmela lo antes posible. Si es casi la hora de la prxima dosis, tome slo esa dosis. No tome dosis adicionales o dobles. Debe transcurrir un perodo de por lo menos 4 a 6 horas entre una dosis y la siguiente. Qu puede interactuar con este medicamento? -probenecid -otros antibiticos Puede ser que  esta lista no menciona todas las posibles interacciones. Informe a su profesional de [employer] de [employer] productos a base de hierbas, medicamentos de [place] o suplementos nutritivos que est tomando. Si usted fuma, consume bebidas alcohlicas o si utiliza drogas ilegales, indqueselo tambin a su profesional de [employer]. Algunas sustancias pueden interactuar con su medicamento. A qu debo estar atento al usar [employer]? Si los sntomas no comienzan a mejorar a los [address], informe a su mdico o a su profesional de [employer]. No trate la diarrea con productos de [employer]. Comunquese con su mdico si tiene diarrea que dura ms de 2 das o si es severa y [place]. Si es diabtico podr [place] un resultado positivo falso en los [employer] de determinacin del nivel de azcar en la orina. Consulte con su mdico o con su profesional de [employer]. Qu efectos secundarios puedo tener al [employer] este medicamento? Efectos secundarios que debe informar a su mdico o a Producer, television/film/video de la salud tan pronto como sea posible: -Therapist, art como erupcin cutnea, picazn o urticarias, hinchazn de la cara, labios o lengua -problemas respiratorios -dolor o dificultad para orinar -enrojecimiento, formacin de ampollas, descamacin o aflojamiento de la piel, inclusive dentro de la boca -diarrea severa o acuosa -cansancio o debilidad inusual -color amarillento de los ojos o la piel Efectos secundarios que, por lo general, no requieren  atencin mdica (debe informarlos a su mdico o a su profesional de la salud si persisten o si son molestos): -gas o acidez de Teaching laboratory technician -irritacin genital o anal -dolor de cabeza -dolores musculares y articulares -nuseas, vmitos Puede ser que esta lista no menciona todos los posibles efectos secundarios. Comunquese a su mdico por asesoramiento mdico [employer]. Usted puede informar los efectos secundarios a la FDA por telfono al 1-800-FDA-1088. Dnde debo guardar mi medicina? Mantngala fuera del alcance de los nios. Gurdela a [employer], entre 15 y 30 grados C (41 y 39 grados F). Deseche todo el medicamento que no haya utilizado, despus de la fecha de vencimiento. ATENCIN: Este folleto es un resumen. Puede ser que no cubra toda la posible informacin. Si usted tiene preguntas acerca de esta medicina, consulte con su mdico, su farmacutico o su profesional de Radiographer, therapeutic.    2016, Elsevier/Gold Standard. (2014-05-22 00:00:00) Endometritis (Endometritis) La endometritis es la irritacin, dolor e hinchazn (inflamacin) en la membrana que tapiza el tero (endometrio).  CAUSAS   Infecciones bacterianas.  Enfermedades de transmisin sexual (ETS).  Haber sufrido un aborto o despus de un parto, especialmente despus de un trabajo de parto prolongado o una cesrea.  Ciertos procedimientos ginecolgicos (dilatacin y curetaje, histeroscopa o la insercin de un dispositivo anticonceptivo). SIGNOS Y SNTOMAS   Fiebre.  Dolor en la zona baja del abdomen o dolor plvico.  Secrecin o sangrado vaginal anormal.  Hinchazn abdominal o meteorismo (distensin).  Malestar general, sentirse enferma.  Molestias al mover el intestino. DIAGNSTICO  Le indicarn un examen fsico y de la pelvis. Otras pruebas son:  Cultivos de material obtenido en el cuello del tero.  Anlisis de [place].  Examen de [place] de tejido de las membranas que tapizan el  tero (biopsia de endometrio).  Examen de las secreciones en el microscopio (preparado fresco).  Laparoscopa. TRATAMIENTO  Generalmente se indican antibiticos. Otros tratamientos pueden ser:  Lquidos que se administran por la vena a travs de una va intravenosa IV.  Reposo. INSTRUCCIONES PARA EL CUIDADO EN EL HOGAR  Slo tome medicamentos de venta libre o recetados para Primary school teachercalmar el dolor, Environmental health practitionerel malestar o bajar la fiebre, segn las indicaciones de su mdico.  CenterPoint Energyome los antibiticos como se le indic. Tmelos todos, aunque se sienta mejor.  Puede reanudar su dieta y sus actividades normales segn se le haya indicado o segn su tolerancia.  No se haga duchas vaginales ni tenga relaciones sexuales hasta que el mdico la autorice.  No tenga relaciones sexuales hasta que su compaero haya sido tratado si la causa de la endometritis es una enfermedad de transmisin sexual. SOLICITE ATENCIN MDICA DE INMEDIATO SI:   Presenta hinchazn o aumento del dolor en el abdomen.  Tiene fiebre.  Tiene una secrecin vaginal con mal olor, o ha aumentado la cantidad de Midwifesecrecin.  Brett Fairybserva una hemorragia vaginal anormal.  El medicamento no le Scientist, clinical (histocompatibility and immunogenetics)calma el dolor.  Tiene algn problema que puede relacionarse con el medicamento que est tomando.  Comienza a sentir nuseas y vmitos o no puede Comcastretener los alimentos.  Siente dolor al mover el intestino. ASEGRESE DE QUE:   Comprende estas instrucciones.  Controlar su afeccin.  Recibir ayuda de inmediato si no mejora o si empeora.   Esta informacin no tiene Theme park managercomo fin reemplazar el consejo del mdico. Asegrese de hacerle al mdico cualquier pregunta que tenga.   Document Released: 01/07/2005 Document Revised: 04/04/2013 Elsevier Interactive Patient Education [employer]2016 Elsevier Inc.

## 2015-09-12 NOTE — Telephone Encounter (Signed)
Patient had LEEP in office on 09/05/15.  C/o chills and fever at night x3 nights. Reports 102.3 temp last night. Patient notes she has a fever blisters on her lip. Still has some discomfort, heavy feeling in pelvis and cramping. C/o yellow discharge with some blood in it. Still using Cleocin cream. Patient is concerned. Offered office visit to assess and patient would like to come in and be checked. Appt scheduled.

## 2015-09-14 LAB — URINE CULTURE
COLONY COUNT: NO GROWTH
ORGANISM ID, BACTERIA: NO GROWTH

## 2015-09-26 ENCOUNTER — Encounter: Payer: Self-pay | Admitting: Gynecology

## 2015-09-26 ENCOUNTER — Ambulatory Visit (INDEPENDENT_AMBULATORY_CARE_PROVIDER_SITE_OTHER): Payer: 59 | Admitting: Gynecology

## 2015-09-26 VITALS — BP 126/74

## 2015-09-26 DIAGNOSIS — Z09 Encounter for follow-up examination after completed treatment for conditions other than malignant neoplasm: Secondary | ICD-10-CM

## 2015-09-26 DIAGNOSIS — Z8639 Personal history of other endocrine, nutritional and metabolic disease: Secondary | ICD-10-CM | POA: Insufficient documentation

## 2015-09-26 NOTE — Progress Notes (Signed)
   Patient is a 45 year old who presented to the office today for 3 week postop visit she is status post LEEP cervical conization. Her history is as follows:  Patient is a 45 year old was seen in the office on February 2 for her annual gynecological examination and is here today for colposcopic evaluation as a result of 2 prior ASCUS with negative HPV in 2016 in 2017 respectively. 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 She has a Mirena IUD. She recently received Depo-Provera 150 mg IM as a result of left ovarian cyst. She had a normal CA 125.  Patient underwent a detail colposcopic evaluation of the external genitalia, perineum and perirectal region with no lesions seen. The speculum was introduced into the vagina and a systematic inspection of the vagina, fornix, ectocervix did not demonstrate any lesions. Endocervical speculum was utilized and the transformation zone was visualized and no lesions were seen. The IUD string was visualized. An ECC was obtained.  Her pathology report demonstrated the following: Diagnosis Endocervix, curettage - MINUTE DETACHED FRAGMENTS OF SQUAMOUS MUCOSA WITH ATYPIA, SEE COMMENT. Microscopic Comment The background shows benign endocervical mucosa. In this background, there are small detached fragments of primarily metaplastic appearing mucosa displaying epithelial atypia in the form of nuclear enlargement and hyperchromasia. The changes are concerning for a squamous dysplastic process but the findings are limited and hence clinical correlation is strongly recommended. (BNS:ecj 06/10/2015  Patient underwent LEEP cervical conization in the office on 09/05/2015 pathology report demonstrated the following: Diagnosis Cervix, LEEP, cone - ATROPHIC TRANSFORMATION ZONE MUCOSA. - NO DYSPLASIA OR MALIGNANCY IDENTIFIED. -  SEE COMMENT. Microscopic Comment As the squamous mucosa demonstrates some degree of atypia, a p16 immunohistochemical stain is performed on all four blocks  Her records also indicated that in February this year she was found to have vitamin D deficiency with a value of 24 and just completed recently for treatment with 50,000 units of vitamin D for 12 weeks and is here to check her vitamin D level as well.  Exam: Gen. appearance: Well developed well nourished female in no acute distress Abdomen: Soft nontender no rebound or guarding Pelvic: Bartholin urethra Skene was within normal limits Vagina: No lesions or discharge Cervix: Cervical, bed almost completely healed slightly friable on some areas overnight should applied Bimanual exam not done Rectal exam: Not done  Assessment/plan: Patient 3 weeks status post LEEP cervical conization as a result of ECC demonstrating squamous mucosa with atypia, biopsy specimen did not demonstrate any dysplasia. We'll continue to monitor with Pap smear in one year. Patient to return in 3 weeks for final postop visit. Because her vitamin D deficiency and recent treatment her vitamin D level be checked today.

## 2015-09-27 LAB — VITAMIN D 25 HYDROXY (VIT D DEFICIENCY, FRACTURES): VIT D 25 HYDROXY: 33 ng/mL (ref 30–100)

## 2015-10-16 ENCOUNTER — Encounter: Payer: Self-pay | Admitting: Gynecology

## 2015-10-16 ENCOUNTER — Ambulatory Visit (INDEPENDENT_AMBULATORY_CARE_PROVIDER_SITE_OTHER): Payer: 59 | Admitting: Gynecology

## 2015-10-16 VITALS — BP 106/70

## 2015-10-16 DIAGNOSIS — Z09 Encounter for follow-up examination after completed treatment for conditions other than malignant neoplasm: Secondary | ICD-10-CM

## 2015-10-16 NOTE — Progress Notes (Signed)
   Patient presents to the office for her six-week postop visit. She status post LEEP cervical conization. Her history is as follows:  Patient is a 45 year old was seen in the office on February 2 for her annual gynecological examination and is here today for colposcopic evaluation as a result of 2 prior ASCUS with negative HPV in 2016 in 2017 respectively. 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 She has a Mirena IUD. She recently received Depo-Provera 150 mg IM as a result of left ovarian cyst. She had a normal CA 125.  Patient underwent a detail colposcopic evaluation of the external genitalia, perineum and perirectal region with no lesions seen. The speculum was introduced into the vagina and a systematic inspection of the vagina, fornix, ectocervix did not demonstrate any lesions. Endocervical speculum was utilized and the transformation zone was visualized and no lesions were seen. The IUD string was visualized. An ECC was obtained.  Her pathology report demonstrated the following: Diagnosis Endocervix, curettage - MINUTE DETACHED FRAGMENTS OF SQUAMOUS MUCOSA WITH ATYPIA, SEE COMMENT. Microscopic Comment The background shows benign endocervical mucosa. In this background, there are small detached fragments of primarily metaplastic appearing mucosa displaying epithelial atypia in the form of nuclear enlargement and hyperchromasia. The changes are concerning for a squamous dysplastic process but the findings are limited and hence clinical correlation is strongly recommended. (BNS:ecj 06/10/2015  Patient underwent LEEP cervical conization in the office on 09/05/2015 pathology report demonstrated the following: Diagnosis Cervix, LEEP, cone - ATROPHIC TRANSFORMATION ZONE MUCOSA. - NO DYSPLASIA OR MALIGNANCY IDENTIFIED. - SEE  COMMENT. Microscopic Comment As the squamous mucosa demonstrates some degree of atypia, a p16 immunohistochemical stain is performed on all four blocks  Patient is otherwise asymptomatic. Patient recently treated for vitamin D deficiency in her follow-up vitamin D level in June 15 of back to normal. She is currently taking vitamin D 2000 units daily.  Exam: Pelvic: Bartholin urethra Skene was within normal limits Vagina: No lesions or discharge Cervix: Cervical bed completely healed Bimanual exam not done Rectal exam: Not done  Assessment/plan: Patient 6 weeks status post LEEP cervical conization as a result of ECC demonstrating squamous mucosa with atypia, biopsy specimen did not demonstrate any dysplasia. We'll continue to monitor with Pap smear in one year

## 2016-05-18 ENCOUNTER — Encounter: Payer: 59 | Admitting: Gynecology

## 2016-05-25 ENCOUNTER — Encounter: Payer: 59 | Admitting: Gynecology

## 2016-08-26 ENCOUNTER — Encounter: Payer: Self-pay | Admitting: Gynecology

## 2016-11-17 ENCOUNTER — Ambulatory Visit (INDEPENDENT_AMBULATORY_CARE_PROVIDER_SITE_OTHER): Payer: Self-pay | Admitting: Obstetrics & Gynecology

## 2016-11-17 ENCOUNTER — Other Ambulatory Visit: Payer: Self-pay

## 2016-11-17 ENCOUNTER — Encounter: Payer: Self-pay | Admitting: Obstetrics & Gynecology

## 2016-11-17 VITALS — BP 112/78

## 2016-11-17 DIAGNOSIS — Z1231 Encounter for screening mammogram for malignant neoplasm of breast: Secondary | ICD-10-CM

## 2016-11-17 DIAGNOSIS — N6012 Diffuse cystic mastopathy of left breast: Secondary | ICD-10-CM

## 2016-11-17 DIAGNOSIS — N644 Mastodynia: Secondary | ICD-10-CM

## 2016-11-17 DIAGNOSIS — N6011 Diffuse cystic mastopathy of right breast: Secondary | ICD-10-CM

## 2016-11-17 NOTE — Progress Notes (Signed)
    Carolyn Fletcher 15-Oct-1970 [id number]        46 y.o.  [id number]   RP:  Bilateral Breast pain and tenderness with Rt breast cysts  HPI:  Was previously told that she has Fibrocystic breast disease.  C/O tender breasts bilaterally, not sure how it relates to her cycle because she has a Mirena IUD with no period.  Feels breast cysts more on Rt than Lt breast on the outside.  No nipple d/c.  No redness of skin.  Past medical history,surgical history, problem list, medications, allergies, family history and social history were all reviewed and documented in the EPIC chart.  Directed ROS with pertinent positives and negatives documented in the history of present illness/assessment and plan.  Exam:  Vitals:   11/17/16 1518  BP: 112/78   General appearance:  Normal  Breast exam:  Rt breast:  Fibrocystic breast tissue more on external quadrants.  No discrete nodule or mass.  No nipple d/c.  No erythema or other skin change. No Rt Axillary LN felt.                         Lt breast:  Fibrocystic breast tissue more on external quadrants.  No discrete nodule or mass.  No nipple d/c.  No erythema or other skin change.  No Lt Axillary LN felt.  Assessment/Plan:  46 y.o. G4P0013   1. Pain of both breasts Will proceed with bilateral Screening Mammo.    2. Fibrocystic breast changes, bilateral No discrete nodule or mass, but generally Fibrocystic breasts bilaterally. Screening Mammography bilaterally.  Counseling on above issue >50% x 15 minutes.   Carolyn DelMarie-Lyne Tashi Band MD, 4:59 PM 11/17/2016

## 2016-11-22 NOTE — Patient Instructions (Signed)
1. Pain of both breasts Will proceed with bilateral Screening Mammo.    2. Fibrocystic breast changes, bilateral No discrete nodule or mass, but generally Fibrocystic breasts bilaterally. Screening Mammography bilaterally  Carolyn Fletcher, it was a pleasure to meet you today!

## 2016-11-23 ENCOUNTER — Other Ambulatory Visit: Payer: Self-pay | Admitting: Obstetrics and Gynecology

## 2016-11-23 DIAGNOSIS — N644 Mastodynia: Secondary | ICD-10-CM

## 2016-12-10 ENCOUNTER — Other Ambulatory Visit: Payer: 59

## 2016-12-10 ENCOUNTER — Ambulatory Visit (HOSPITAL_COMMUNITY): Payer: 59

## 2016-12-31 ENCOUNTER — Ambulatory Visit (HOSPITAL_COMMUNITY)
Admission: RE | Admit: 2016-12-31 | Discharge: 2016-12-31 | Disposition: A | Discharge status: Home / Self Care | Payer: Self-pay | Source: Ambulatory Visit | Attending: Obstetrics and Gynecology | Admitting: Obstetrics and Gynecology

## 2016-12-31 ENCOUNTER — Ambulatory Visit
Admission: RE | Admit: 2016-12-31 | Discharge: 2016-12-31 | Disposition: A | Discharge status: Home / Self Care | Payer: No Typology Code available for payment source | Source: Ambulatory Visit | Attending: Obstetrics and Gynecology | Admitting: Obstetrics and Gynecology

## 2016-12-31 ENCOUNTER — Encounter (HOSPITAL_COMMUNITY): Payer: Self-pay

## 2016-12-31 ENCOUNTER — Ambulatory Visit: Payer: Self-pay

## 2016-12-31 VITALS — BP 94/68 | HR 80 | Temp 98.2°F | Ht 63.0 in | Wt 126.8 lb

## 2016-12-31 DIAGNOSIS — N644 Mastodynia: Secondary | ICD-10-CM

## 2016-12-31 DIAGNOSIS — Z01419 Encounter for gynecological examination (general) (routine) without abnormal findings: Secondary | ICD-10-CM

## 2016-12-31 NOTE — Progress Notes (Signed)
Complaints of left inner and right outer and inner breast pain x 2 months. Patient stated she noticed a right breast lump two months ago. Patient stated she has bilateral white milky discharge that is spontaneous since the birth of her last child.  Pap Smear: Pap smear completed today. Last Pap smear was 05/16/2015 at [facility] and ASCUS with negative HPV. Patient had a colposcopy 06/07/2015 for follow up and a LEEP 09/05/2015. Per patient has a history of seven abnormal Pap smears since 2008. Patient states she has had three colposcopies and three cryotherapy procedures prior to most recent Pap smear and follow-up. Patient's last two Pap smears, colposcopy, and LEEP results are in EPIC.  Physical exam: Breasts Breasts symmetrical. No skin abnormalities bilateral breasts. No nipple retraction bilateral breasts. No nipple discharge bilateral breasts. Unable to express any nipple discharge on exam. No lymphadenopathy. No lumps palpated bilateral breasts. Complaints of left inner breast and right breast pain at nipple area on exam. Referred patient to the [facility] for a diagnostic mammogram. Appointment scheduled for Thursday, December 31, 2016 at 1520.  Pelvic/Bimanual   Ext Genitalia No lesions, no swelling and no discharge observed on external genitalia.         Vagina Vagina pink and normal texture. No lesions or discharge observed in vagina.          Cervix Cervix is present. Cervix pink and of normal texture. No discharge observed.     Uterus Uterus is present and palpable. Uterus in normal position and normal size.        Adnexae Bilateral ovaries present and palpable. No tenderness on palpation.          Rectovaginal No rectal exam completed today since patient had no rectal complaints. No skin abnormalities observed on exam.    Smoking History: Patient has never smoked.  Patient Navigation: Patient education provided. Access to services  provided for patient through [facility] program.

## 2016-12-31 NOTE — Patient Instructions (Signed)
Explained breast self awareness with Robyne Peers. Let patient know that if today's Pap smear is normal that her next Pap smear will be due in one year. Referred patient to the [facility] for a diagnostic mammogram. Appointment scheduled for Thursday, December 31, 2016 at 1520. Let patient know will follow up with her within the next couple weeks with results of Pap smear by phone. Carolyn Fletcher verbalized understanding.  Saory Carriero, Kathaleen Maser, RN 3:13 PM

## 2017-01-01 ENCOUNTER — Encounter (HOSPITAL_COMMUNITY): Payer: Self-pay | Admitting: *Deleted

## 2017-01-04 LAB — CYTOLOGY - PAP
Diagnosis: NEGATIVE
HPV (WINDOPATH): NOT DETECTED

## 2017-01-11 ENCOUNTER — Encounter (HOSPITAL_COMMUNITY): Payer: Self-pay | Admitting: *Deleted

## 2017-01-11 NOTE — Progress Notes (Signed)
Letter mailed to patient with negative pap smear results. HPV was negative. Next pap due in one year due to history of abnormal.

## 2018-01-17 ENCOUNTER — Encounter: Payer: Self-pay | Admitting: Family Medicine

## 2018-01-17 ENCOUNTER — Encounter (INDEPENDENT_AMBULATORY_CARE_PROVIDER_SITE_OTHER): Payer: Self-pay

## 2018-01-17 ENCOUNTER — Ambulatory Visit (INDEPENDENT_AMBULATORY_CARE_PROVIDER_SITE_OTHER): Payer: BLUE CROSS/BLUE SHIELD | Admitting: Family Medicine

## 2018-01-17 VITALS — BP 102/68 | HR 84 | Temp 98.0°F | Ht 63.0 in | Wt 130.4 lb

## 2018-01-17 DIAGNOSIS — H9313 Tinnitus, bilateral: Secondary | ICD-10-CM | POA: Diagnosis not present

## 2018-01-17 DIAGNOSIS — R071 Chest pain on breathing: Secondary | ICD-10-CM | POA: Diagnosis not present

## 2018-01-17 DIAGNOSIS — J209 Acute bronchitis, unspecified: Secondary | ICD-10-CM

## 2018-01-17 DIAGNOSIS — R0789 Other chest pain: Secondary | ICD-10-CM | POA: Insufficient documentation

## 2018-01-17 DIAGNOSIS — Z23 Encounter for immunization: Secondary | ICD-10-CM | POA: Diagnosis not present

## 2018-01-17 DIAGNOSIS — R6889 Other general symptoms and signs: Secondary | ICD-10-CM

## 2018-01-17 MED ORDER — FLUTICASONE PROPIONATE 50 MCG/ACT NA SUSP: 2.0000 | Freq: Every day | NASAL | 2 refills | Status: AC

## 2018-01-17 NOTE — Patient Instructions (Addendum)
Call Center for [facility] at [facility] at [phone] for an appointment.  They are located at [address], [address], [place], [place], [address] (right across the hall from our office).  I think you have bronchitis. Most causes of bronchitis are viral in nature. If we prescribe antibiotics for viral illnesses, not only will it fail to help you get better, but has also been shown to do harm.   Continue to push fluids, practice good hand hygiene, and cover your mouth if you cough.  If you start having fevers, shaking or shortness of breath, seek immediate care.  For symptoms, consider using Vick's VapoRub on chest or under nose, air humidifier, Benadryl at night, and elevating the head of the bed. Tylenol and ibuprofen for aches and pains you may be experiencing.   OK to take Tylenol 1000 mg (2 extra strength tabs) or 975 mg (3 regular strength tabs) every 6 hours as needed.  Ibuprofen 400-600 mg (2-3 over the counter strength tabs) every 6 hours as needed for pain.  Stretch your pectoral muscles. Hold for 30 seconds and repeat 2 times each side. Do this at least 3 times per week.  For your cold sensation, I think you need to add more mass to increase your metabolism and physical insulation. Try to lift weights. I think this could be healthy for a variety of things, including your temperature intolerance.  The ringing in your ears could be due to your inner ear. Try Flonase, 2 sprays each nose on a daily basis. Give it a couple weeks with consistent usage.   Let us know if you need anything.

## 2018-01-17 NOTE — Progress Notes (Signed)
Chief Complaint  Patient presents with  . New Patient (Initial Visit)       New Patient Visit SUBJECTIVE: HPI: Carolyn Fletcher is an 47 y.o.female who is being seen for establishing care.  The patient has not had pcp in quite some time.  Ringing in ears for 1 yr. Worse on R. No loud noise exposure or inj. +intermittent ear pain. Has hx of allergies. Used to take Zyrtec, but now only essential oils. Uses saline nasal spray. No hx of asa use.   18 mo ago, started having chest wall pain anteriorly. Feels like her ribs are uneven. Was seen in ED around 1 year ago and dx'd w reflux. Denies inj or change in activity. She does not stretch/exercise routinely. She will sometimes try to stretch her pecs which does help.  Feels like she is cold all the time. TSH in past has been normal. No unintentional wt loss. Getting worse with age.   Duration: 10 days  Associated symptoms: rhinorrhea, ear pain and cough Denies: sinus pain, itchy watery eyes, ear drainage, sore throat, shortness of breath, myalgia and fevers Treatment to date: ess oils Sick contacts: Yes     Allergies  Allergen Reactions  . Latex Itching    Itching in throat and nose and ears with latex    Past Medical History:  Diagnosis Date  . ASCUS favor benign   . Headache   . Migraine   . Rheumatic fever   . S/P ectopic pregnancy   . Vitamin D deficiency    Past Surgical History:  Procedure Laterality Date  . CERVICAL BIOPSY  W/ LOOP ELECTRODE EXCISION    . COLPOSCOPY    . DIAGNOSTIC LAPAROSCOPY     ectopic pregnancy  . ECTOPIC PREGNANCY SURGERY  2004   Family History  Problem Relation Age of Onset  . Hypertension Mother   . Hyperlipidemia Mother   . Arthritis Mother   . Hypertension Father   . Heart disease Father   . Hyperlipidemia Father   . Cancer Father        prostate  . Kidney disease Father   . Heart disease Sister   . Cancer Maternal Grandfather        lung  . Cancer Paternal Grandmother    colon uterine  . Cancer Paternal Grandfather        colon  . Heart disease Paternal Grandfather   . Arthritis Maternal Grandmother   . Depression Maternal Grandmother   . Dementia Maternal Grandmother    Allergies  Allergen Reactions  . Latex Itching    Itching in throat and nose and ears with latex    Current Outpatient Medications:  .  levonorgestrel (MIRENA) 20 MCG/24HR IUD, 1 each by Intrauterine route once., Disp: , Rfl:  .  fluticasone (FLONASE) 50 MCG/ACT nasal spray, Place 2 sprays into both nostrils daily., Disp: 16 g, Rfl: 2  Current Facility-Administered Medications:  .  levonorgestrel (MIRENA) 20 MCG/24HR IUD, , Intrauterine, Once, Ok Edwards, MD  No LMP recorded. (Menstrual status: IUD).  ROS Cardiovascular: Denies chest pain  Respiratory: Denies dyspnea   OBJECTIVE: BP 102/68 (BP Location: Left Arm, Patient Position: Sitting, Cuff Size: Normal)   Pulse 84   Temp 98 F (36.7 C) (Oral)   Ht 5\' 3"  (1.6 m)   Wt 130 lb 6 oz (59.1 kg)   SpO2 98%   BMI 23.09 kg/m   Constitutional: -  VS reviewed -  Well developed, well nourished, appears stated  age -  No apparent distress  Psychiatric: -  Oriented to person, place, and time -  Memory intact -  Affect and mood normal -  Fluent conversation, good eye contact -  Judgment and insight age appropriate  Eye: -  Conjunctivae clear, no discharge -  Pupils symmetric, round, reactive to light  ENMT: -  MMM    Pharynx moist, no exudate, no erythema  Neck: -  No gross swelling, no palpable masses -  Thyroid midline, not enlarged, mobile, no palpable masses  Cardiovascular: -  RRR -  No LE edema  Respiratory: -  Normal respiratory effort, no accessory muscle use, no retraction -  Breath sounds equal, no wheezes, no ronchi, no crackles  Gastrointestinal: -  Bowel sounds normal -  No tenderness, no distention, no guarding, no masses  Neurological:  -  CN II - XII grossly intact -  Sensation grossly intact to  light touch, equal bilaterally  Musculoskeletal: -  No clubbing, no cyanosis -  Gait normal  Skin: -  No significant lesion on inspection -  Warm and dry to palpation   ASSESSMENT/PLAN: Costochondral chest pain  Acute bronchitis, unspecified organism  Tinnitus of both ears - Plan: fluticasone (FLONASE) 50 MCG/ACT nasal spray  Need for influenza vaccination - Plan: Flu Vaccine QUAD 6+ mos PF IM (Fluarix Quad PF)  Cold intolerance  Info for OB/GYN given. Ice, stretch pecs.  Supportive care, offered cough suppressant, but she declined. Tx with INCS should it be related to ETD. OK to try going back on Zyrtec. For cold intolerance, rec'd she try to add muscle mass to improve her metabolism and physical insulation. Patient should return in 1 mo for CPE. The patient voiced understanding and agreement to the plan.   [name], DO 01/17/18  2:47 PM

## 2018-01-17 NOTE — Progress Notes (Signed)
Pre visit review using our clinic review tool, if applicable. No additional management support is needed unless otherwise documented below in the visit note. 

## 2018-01-27 IMAGING — MG MM DIAG BREAST TOMO UNI RIGHT
6 series · 6 of 14 positions shown · non-contrast
Comparison: None

CLINICAL DATA: The patient was called back from screening
mammography for right breast mass

EXAM:
2D DIGITAL DIAGNOSTIC RIGHT MAMMOGRAM WITH CAD AND ADJUNCT TOMO
ULTRASOUND RIGHT BREAST

[R MLO synth-2D]
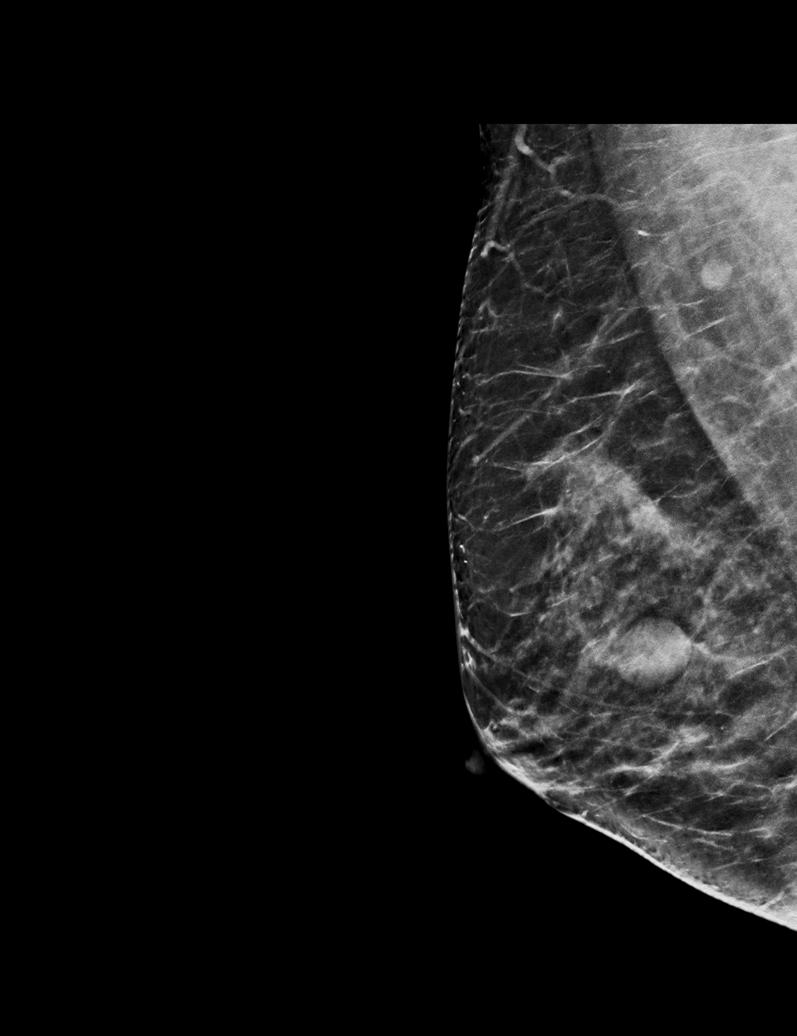

[R MLO]
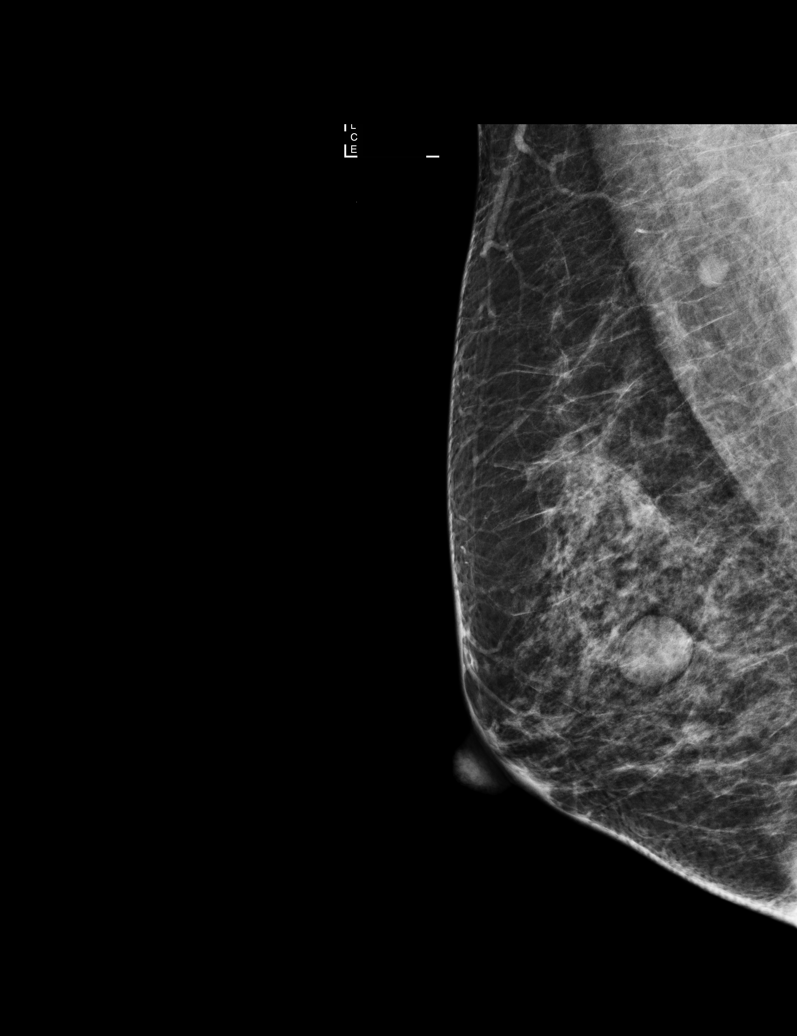

[R CC]
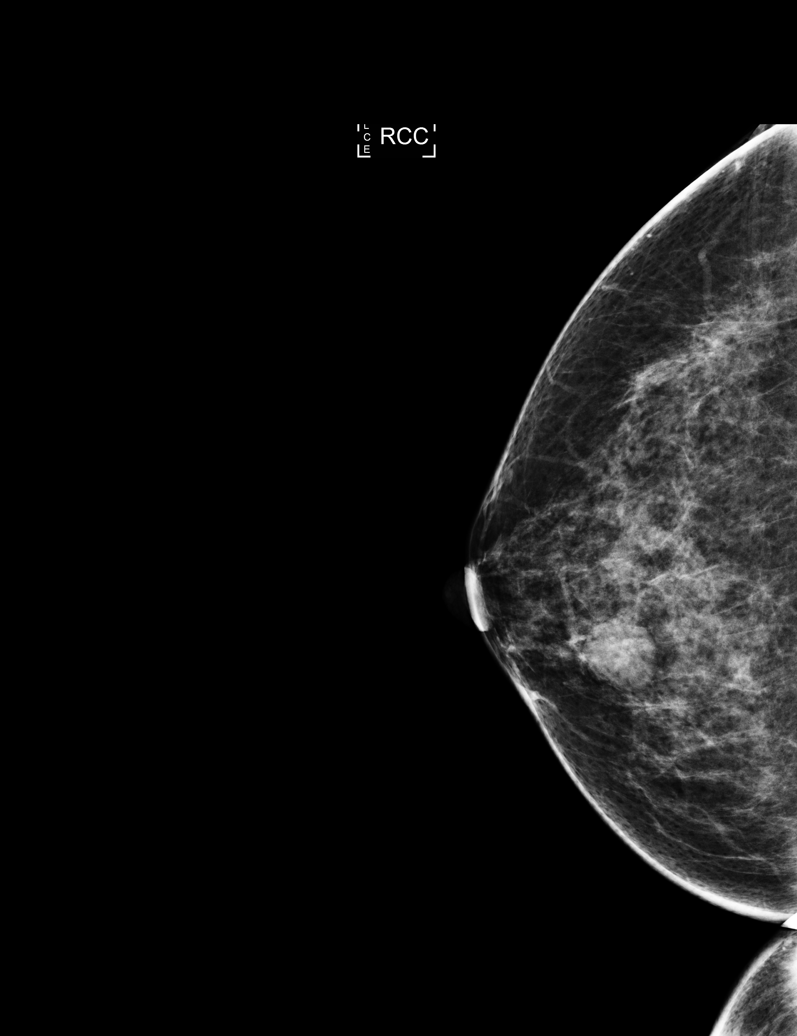

[R CC synth-2D]
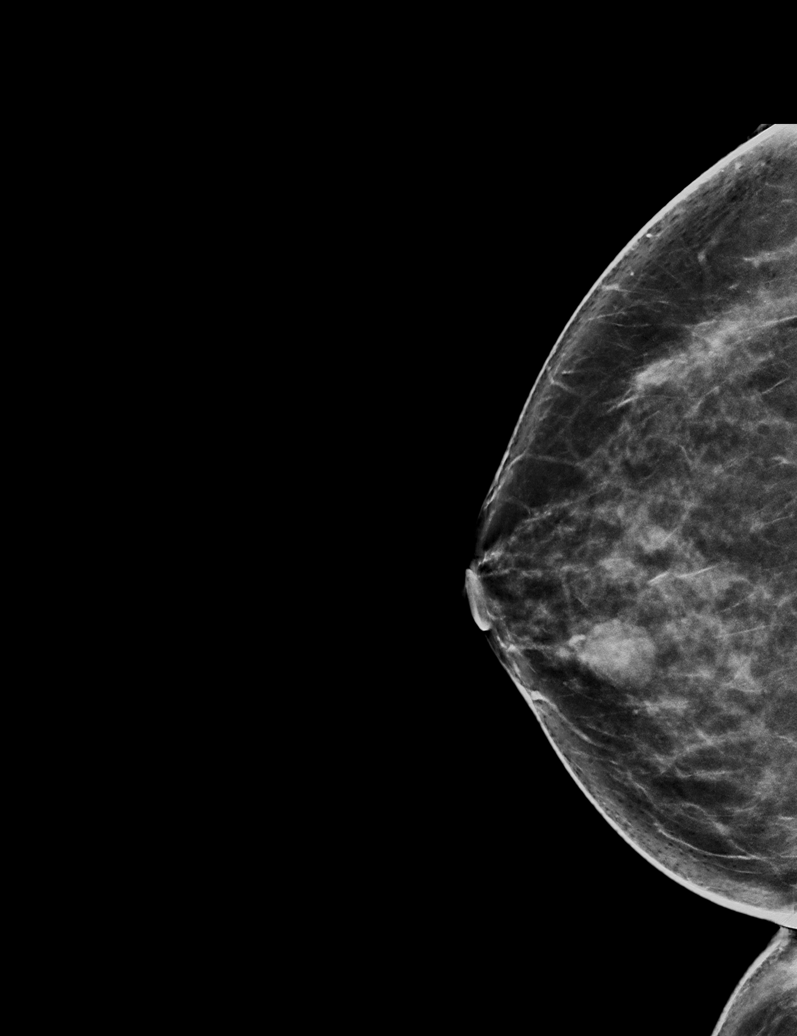

[R MLO tomo · tomo slice 35/68.0]
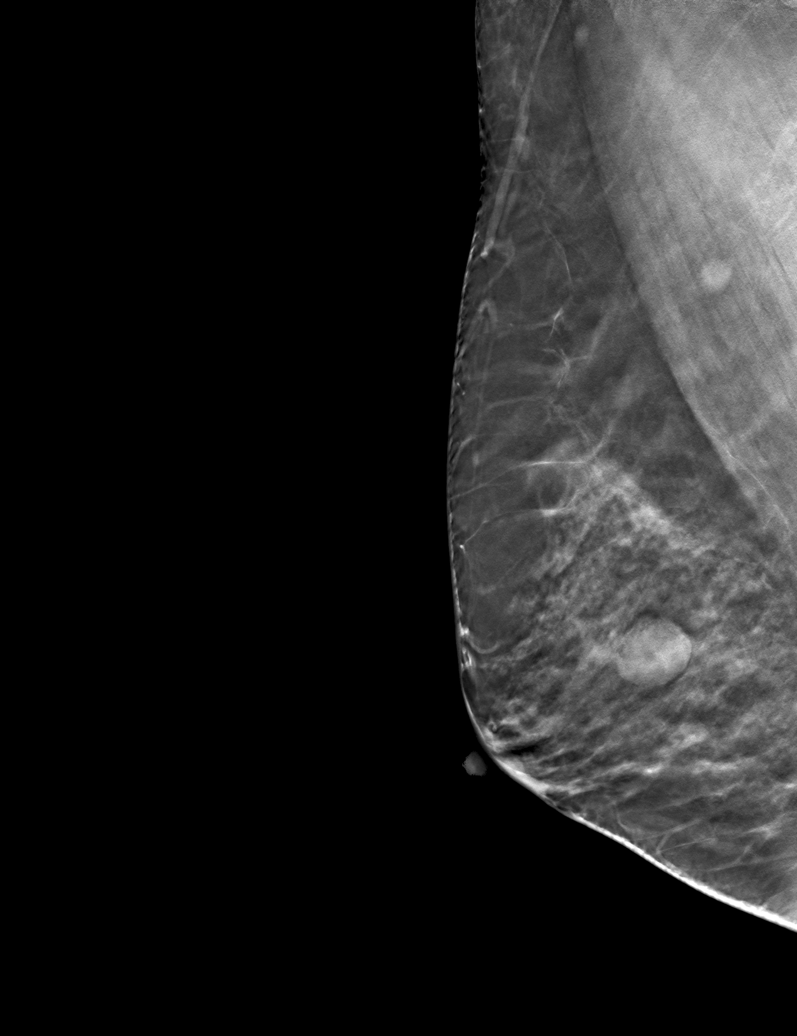

[R CC tomo · tomo slice 37/72.0]
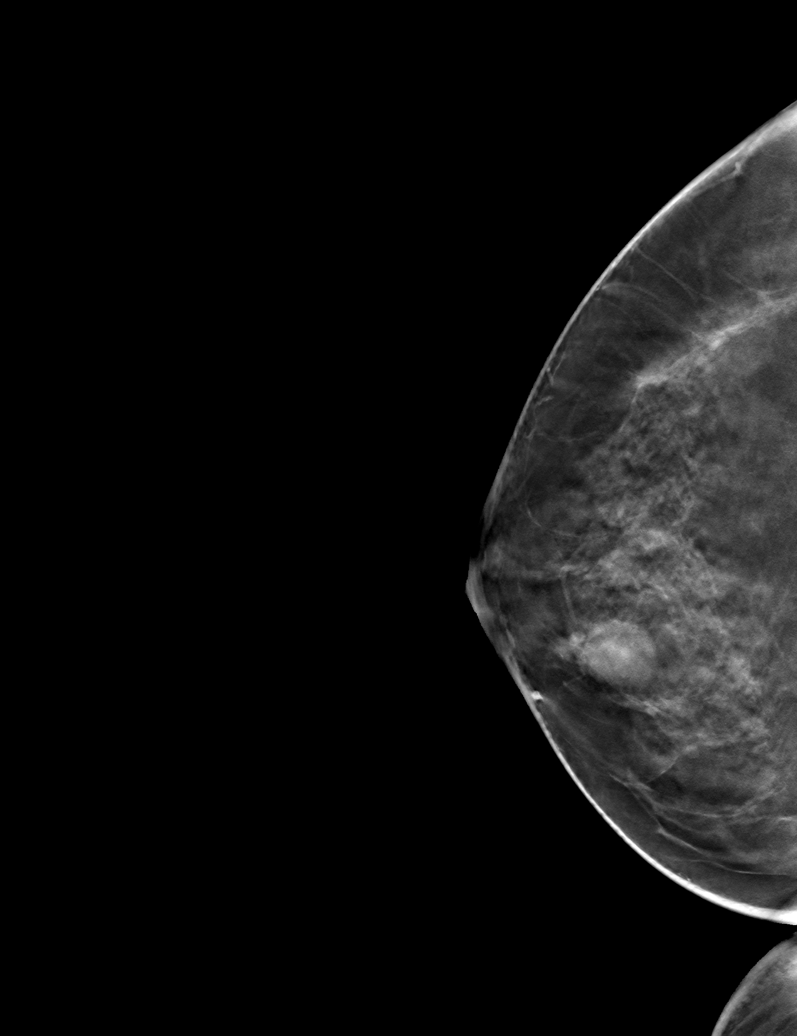

[6 of 14 positions shown; findings below may reference images not displayed]

ACR Breast Density Category c: The breast tissue is heterogeneously
dense, which may obscure small masses.
FINDINGS: There is an oval circumscribed isodense mass in the slightly medial
right breast measuring 16 mm.

Mammographic images were processed with CAD.

On physical exam, no suspicious lumps are identified.

Targeted ultrasound is performed, showing a simple cyst correlating
with the mammographic finding.
IMPRESSION: Fibrocystic changes.

RECOMMENDATION:
Annual screening mammography.

I have discussed the findings and recommendations with the patient.
Results were also provided in writing at the conclusion of the
visit. If applicable, a reminder letter will be sent to the patient
regarding the next appointment.

BI-RADS CATEGORY  2: Benign.

## 2018-02-24 ENCOUNTER — Ambulatory Visit (INDEPENDENT_AMBULATORY_CARE_PROVIDER_SITE_OTHER): Payer: BLUE CROSS/BLUE SHIELD | Admitting: Family Medicine

## 2018-02-24 ENCOUNTER — Encounter: Payer: Self-pay | Admitting: Family Medicine

## 2018-02-24 VITALS — BP 118/70 | HR 89 | Temp 97.6°F | Ht 63.0 in | Wt 130.5 lb

## 2018-02-24 DIAGNOSIS — Z0000 Encounter for general adult medical examination without abnormal findings: Secondary | ICD-10-CM | POA: Diagnosis not present

## 2018-02-24 DIAGNOSIS — Z1239 Encounter for other screening for malignant neoplasm of breast: Secondary | ICD-10-CM

## 2018-02-24 NOTE — Progress Notes (Signed)
Pre visit review using our clinic review tool, if applicable. No additional management support is needed unless otherwise documented below in the visit note. 

## 2018-02-24 NOTE — Patient Instructions (Signed)
Give us 2-3 business days to get the results of your labs back.   Keep the diet clean and stay active.  Aim to do some physical exertion for 150 minutes per week. This is typically divided into 5 days per week, 30 minutes per day. The activity should be enough to get your heart rate up. Anything is better than nothing if you have time constraints.  Let us know if you need anything.  

## 2018-02-24 NOTE — Progress Notes (Signed)
Chief Complaint  Patient presents with  . Annual Exam     Well Woman Carolyn Fletcher is here for a complete physical.   Her last physical was >1 year ago.  Current diet: in general, a "healthy" diet. Current exercise: None Weight is stable and she denies daytime fatigue. No LMP recorded. (Menstrual status: IUD). Seatbelt? Yes  Health Maintenance Pap/HPV- Yes Mammogram- No Tetanus- Yes 6 years ago HIV screening- Yes - 1 yr  Past Medical History:  Diagnosis Date  . ASCUS favor benign   . Headache   . Migraine   . Rheumatic fever   . S/P ectopic pregnancy   . Vitamin D deficiency      Past Surgical History:  Procedure Laterality Date  . CERVICAL BIOPSY  W/ LOOP ELECTRODE EXCISION    . COLPOSCOPY    . DIAGNOSTIC LAPAROSCOPY     ectopic pregnancy  . ECTOPIC PREGNANCY SURGERY  2004    Medications  Current Outpatient Medications on File Prior to Visit  Medication Sig Dispense Refill  . fluticasone (FLONASE) 50 MCG/ACT nasal spray Place 2 sprays into both nostrils daily. 16 g 2  . levonorgestrel (MIRENA) 20 MCG/24HR IUD 1 each by Intrauterine route once.     Current Facility-Administered Medications on File Prior to Visit  Medication Dose Route Frequency Provider Last Rate Last Dose  . levonorgestrel (MIRENA) 20 MCG/24HR IUD   Intrauterine Once Ok EdwardsFernandez, Juan H, MD         Allergies Allergies  Allergen Reactions  . Latex Itching    Itching in throat and nose and ears with latex    Review of Systems: Constitutional:  no unexpected weight changes Eye:  no recent significant change in vision Ear/Nose/Mouth/Throat:  Ears:  no tinnitus or vertigo and no recent change in hearing Nose/Mouth/Throat:  no complaints of nasal congestion, no sore throat Cardiovascular: no chest pain Respiratory:  no cough and no shortness of breath Gastrointestinal:  no abdominal pain, no change in bowel habits GU:  Female: negative for dysuria or pelvic  pain Musculoskeletal/Extremities:  no pain of the joints Integumentary (Skin/Breast):  no abnormal skin lesions reported Neurologic:  no headaches Endocrine:  denies fatigue Hematologic/Lymphatic:  No areas of easy bleeding  Exam BP 118/70 (BP Location: Left Arm, Patient Position: Sitting, Cuff Size: Normal)   Pulse 89   Temp 97.6 F (36.4 C) (Oral)   Ht 5\' 3"  (1.6 m)   Wt 130 lb 8 oz (59.2 kg)   SpO2 98%   BMI 23.12 kg/m  General:  well developed, well nourished, in no apparent distress Skin:  no significant moles, warts, or growths Head:  no masses, lesions, or tenderness Eyes:  pupils equal and round, sclera anicteric without injection Ears:  canals without lesions, TMs shiny without retraction, no obvious effusion, no erythema Nose:  nares patent, septum midline, mucosa normal, and no drainage or sinus tenderness Throat/Pharynx:  lips and gingiva without lesion; tongue and uvula midline; non-inflamed pharynx; no exudates or postnasal drainage Neck: neck supple without adenopathy, thyromegaly, or masses Lungs:  clear to auscultation, breath sounds equal bilaterally, no respiratory distress Cardio:  regular rate and rhythm, no bruits, no LE edema Abdomen:  abdomen soft, nontender; bowel sounds normal; no masses or organomegaly Genital: Defer to GYN Musculoskeletal:  symmetrical muscle groups noted without atrophy or deformity Extremities:  no clubbing, cyanosis, or edema, no deformities, no skin discoloration Neuro:  gait normal; deep tendon reflexes normal and symmetric Psych: well oriented with normal range  of affect and appropriate judgment/insight  Assessment and Plan  Well adult exam - Plan: Comprehensive metabolic panel, Lipid panel, CBC, TSH  Screening for breast cancer - Plan: MM DIGITAL SCREENING BILATERAL   Well 47 y.o. female. Counseled on diet and exercise. Needs to exercise more.  Other orders as above. Follow up in 1 yr or prn. The patient voiced  understanding and agreement to the plan.  Jilda Roche [place], DO 02/24/18 3:21 PM

## 2018-02-25 ENCOUNTER — Other Ambulatory Visit: Payer: Self-pay | Admitting: Family Medicine

## 2018-02-25 DIAGNOSIS — R7401 Elevation of levels of liver transaminase levels: Secondary | ICD-10-CM

## 2018-02-25 DIAGNOSIS — R740 Nonspecific elevation of levels of transaminase and lactic acid dehydrogenase [LDH]: Principal | ICD-10-CM

## 2018-02-25 LAB — COMPREHENSIVE METABOLIC PANEL
ALBUMIN: 4.3 g/dL (ref 3.5–5.2)
ALK PHOS: 65 U/L (ref 39–117)
ALT: 78 U/L — ABNORMAL HIGH (ref 0–35)
AST: 39 U/L — ABNORMAL HIGH (ref 0–37)
BUN: 17 mg/dL (ref 6–23)
CALCIUM: 9.2 mg/dL (ref 8.4–10.5)
CO2: 30 mEq/L (ref 19–32)
Chloride: 102 mEq/L (ref 96–112)
Creatinine, Ser: 0.81 mg/dL (ref 0.40–1.20)
GFR: 81.16 mL/min (ref >60.00)
Glucose, Bld: 87 mg/dL (ref 70–99)
POTASSIUM: 4.1 meq/L (ref 3.5–5.1)
Sodium: 138 mEq/L (ref 135–145)
TOTAL PROTEIN: 7 g/dL (ref 6.0–8.3)
Total Bilirubin: 0.4 mg/dL (ref 0.2–1.2)

## 2018-02-25 LAB — LIPID PANEL
CHOLESTEROL: 168 mg/dL (ref 0–200)
HDL: 54 mg/dL (ref >39.00)
LDL Cholesterol: 90 mg/dL (ref 0–99)
NonHDL: 113.93
Total CHOL/HDL Ratio: 3
Triglycerides: 118 mg/dL (ref 0.0–149.0)
VLDL: 23.6 mg/dL (ref 0.0–40.0)

## 2018-02-25 LAB — CBC
HEMATOCRIT: 42.4 % (ref 36.0–46.0)
HEMOGLOBIN: 14.2 g/dL (ref 12.0–15.0)
MCHC: 33.6 g/dL (ref 30.0–36.0)
MCV: 94.5 fl (ref 78.0–100.0)
PLATELETS: 344 10*3/uL (ref 150.0–400.0)
RBC: 4.49 Mil/uL (ref 3.87–5.11)
RDW: 13.1 % (ref 11.5–15.5)
WBC: 5.5 10*3/uL (ref 4.0–10.5)

## 2018-02-25 LAB — TSH: TSH: 2.18 u[IU]/mL (ref 0.35–4.50)

## 2018-03-02 ENCOUNTER — Other Ambulatory Visit (INDEPENDENT_AMBULATORY_CARE_PROVIDER_SITE_OTHER): Payer: BLUE CROSS/BLUE SHIELD

## 2018-03-02 ENCOUNTER — Other Ambulatory Visit: Payer: Self-pay | Admitting: Family Medicine

## 2018-03-02 DIAGNOSIS — R740 Nonspecific elevation of levels of transaminase and lactic acid dehydrogenase [LDH]: Secondary | ICD-10-CM

## 2018-03-02 DIAGNOSIS — R7401 Elevation of levels of liver transaminase levels: Secondary | ICD-10-CM

## 2018-03-02 LAB — HEPATIC FUNCTION PANEL
ALT: 51 U/L — AB (ref 0–35)
AST: 26 U/L (ref 0–37)
Albumin: 4.2 g/dL (ref 3.5–5.2)
Alkaline Phosphatase: 62 U/L (ref 39–117)
BILIRUBIN DIRECT: 0.1 mg/dL (ref 0.0–0.3)
TOTAL PROTEIN: 6.7 g/dL (ref 6.0–8.3)
Total Bilirubin: 0.8 mg/dL (ref 0.2–1.2)

## 2018-03-17 ENCOUNTER — Ambulatory Visit (HOSPITAL_BASED_OUTPATIENT_CLINIC_OR_DEPARTMENT_OTHER)
Admission: RE | Admit: 2018-03-17 | Discharge: 2018-03-17 | Disposition: A | Discharge status: Home / Self Care | Payer: BLUE CROSS/BLUE SHIELD | Source: Ambulatory Visit | Attending: Family Medicine | Admitting: Family Medicine

## 2018-03-17 DIAGNOSIS — Z1239 Encounter for other screening for malignant neoplasm of breast: Secondary | ICD-10-CM

## 2018-06-02 ENCOUNTER — Other Ambulatory Visit: Payer: BLUE CROSS/BLUE SHIELD

## 2018-06-29 ENCOUNTER — Other Ambulatory Visit: Payer: Self-pay

## 2018-06-29 ENCOUNTER — Ambulatory Visit (INDEPENDENT_AMBULATORY_CARE_PROVIDER_SITE_OTHER): Payer: BLUE CROSS/BLUE SHIELD | Admitting: Obstetrics & Gynecology

## 2018-06-29 VITALS — BP 110/72 | Wt 126.6 lb

## 2018-06-29 DIAGNOSIS — Z975 Presence of (intrauterine) contraceptive device: Secondary | ICD-10-CM | POA: Diagnosis not present

## 2018-06-29 DIAGNOSIS — N921 Excessive and frequent menstruation with irregular cycle: Secondary | ICD-10-CM | POA: Diagnosis not present

## 2018-06-29 NOTE — Progress Notes (Signed)
    Carolyn Fletcher [birth year] [id number]        48 y.o.  G4P0013 single  RP: Frequent breakthrough bleeding with cramps on Mirena IUD  HPI: Mirena IUD x 01/2014.  Has started cramping frequently for the last 3 weeks.  Also complains of breakthrough bleeding about 2 weeks every month.  No abnormal vaginal discharge otherwise.  Urine and bowel movements normal.  No fever.  Currently abstinent.   OB History  Gravida Para Term Preterm AB Living  4 3     1 3   SAB TAB Ectopic Multiple Live Births      1        # Outcome Date GA Lbr Len/2nd Weight Sex Delivery Anes PTL Lv  4 Ectopic           3 Para           2 Para           1 Para             Past medical history,surgical history, problem list, medications, allergies, family history and social history were all reviewed and documented in the EPIC chart.   Directed ROS with pertinent positives and negatives documented in the history of present illness/assessment and plan.  Exam:  Vitals:   06/29/18 1146  BP: 110/72  Weight: 126 lb 9.6 oz (57.4 kg)   General appearance:  Normal  Abdomen: Normal  Gynecologic exam: Vulva normal.  Speculum: Cervix normal.  Short IUD strings visible.  Normal vaginal secretions.  No current bleeding.  Bimanual exam: Uterus anteverted, normal volume, nontender.  No adnexal mass, nontender bilaterally.   Assessment/Plan:  48 y.o. G4P0013   1. Breakthrough bleeding with IUD Breakthrough bleeding on Mirena IUD which was inserted October 2015.  Normal gynecologic exam today with IUD in good position with the strings visible at the exocervix.  Follow-up with a pelvic ultrasound to rule out endometrial pathology.  Offered to change the IUD at the head of October 2015.  Declines controlling breakthrough bleeding by adding birth control pills at this point. - [place] Transvaginal Non-OB; Future  Schedule annual gynecologic exam.  Counseling on above issues and coordination of care more than 50% for 25  minutes.  Genia Del MD, 12:08 PM 06/29/2018

## 2018-07-03 ENCOUNTER — Encounter: Payer: Self-pay | Admitting: Obstetrics & Gynecology

## 2018-07-03 NOTE — Patient Instructions (Signed)
1. Breakthrough bleeding with IUD Breakthrough bleeding on Mirena IUD which was inserted October 2015.  Normal gynecologic exam today with IUD in good position with the strings visible at the exocervix.  Follow-up with a pelvic ultrasound to rule out endometrial pathology.  Offered to change the IUD at the head of October 2015.  Declines controlling breakthrough bleeding by adding birth control pills at this point. - [place] Transvaginal Non-OB; Future  Schedule annual gynecologic exam.  dari, martorella un placer verle hoy!

## 2018-08-18 ENCOUNTER — Encounter: Payer: Self-pay | Admitting: Obstetrics & Gynecology

## 2019-01-23 ENCOUNTER — Other Ambulatory Visit: Payer: Self-pay

## 2019-01-23 DIAGNOSIS — Z20822 Contact with and (suspected) exposure to covid-19: Secondary | ICD-10-CM

## 2019-01-24 LAB — NOVEL CORONAVIRUS, NAA: SARS-CoV-2, NAA: NOT DETECTED

## 2020-11-21 ENCOUNTER — Encounter: Payer: Self-pay | Admitting: Obstetrics & Gynecology
